# Patient Record
Sex: Female | Born: 1955 | Race: White | Hispanic: No | Marital: Married | Smoking: Never smoker
Health system: Southern US, Community
[De-identification: ages and names within clinical notes are randomized; demographics above are authoritative.]

## PROBLEM LIST (undated history)

## (undated) DIAGNOSIS — C4491 Basal cell carcinoma of skin, unspecified: Secondary | ICD-10-CM

## (undated) DIAGNOSIS — K219 Gastro-esophageal reflux disease without esophagitis: Secondary | ICD-10-CM

## (undated) DIAGNOSIS — C4492 Squamous cell carcinoma of skin, unspecified: Secondary | ICD-10-CM

## (undated) DIAGNOSIS — M1990 Unspecified osteoarthritis, unspecified site: Secondary | ICD-10-CM

## (undated) DIAGNOSIS — C4490 Unspecified malignant neoplasm of skin, unspecified: Secondary | ICD-10-CM

## (undated) HISTORY — DX: Unspecified malignant neoplasm of skin, unspecified: C44.90

## (undated) HISTORY — DX: Unspecified osteoarthritis, unspecified site: M19.90

## (undated) HISTORY — PX: TUBAL LIGATION: SHX77

## (undated) HISTORY — DX: Squamous cell carcinoma of skin, unspecified: C44.92

## (undated) HISTORY — PX: FOOT SURGERY: SHX648

## (undated) HISTORY — DX: Gastro-esophageal reflux disease without esophagitis: K21.9

## (undated) HISTORY — PX: DILATION AND CURETTAGE OF UTERUS: SHX78

## (undated) HISTORY — DX: Basal cell carcinoma of skin, unspecified: C44.91

---
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
## 1998-11-28 HISTORY — PX: BREAST EXCISIONAL BIOPSY: SUR124

## 1998-11-28 HISTORY — PX: BREAST BIOPSY: SHX20

## 2000-05-05 ENCOUNTER — Encounter: Admission: RE | Admit: 2000-05-05 | Discharge: 2000-05-05 | Payer: Self-pay | Admitting: Unknown Physician Specialty

## 2000-05-05 ENCOUNTER — Encounter: Payer: Self-pay | Admitting: Unknown Physician Specialty

## 2000-09-01 ENCOUNTER — Encounter: Admission: RE | Admit: 2000-09-01 | Discharge: 2000-09-01 | Payer: Self-pay | Admitting: Unknown Physician Specialty

## 2000-09-01 ENCOUNTER — Encounter: Payer: Self-pay | Admitting: Unknown Physician Specialty

## 2007-07-05 ENCOUNTER — Ambulatory Visit: Payer: Self-pay | Admitting: Gastroenterology

## 2007-09-05 ENCOUNTER — Ambulatory Visit: Payer: Self-pay | Admitting: Unknown Physician Specialty

## 2007-09-29 ENCOUNTER — Ambulatory Visit: Payer: Self-pay | Admitting: Unknown Physician Specialty

## 2010-11-28 HISTORY — PX: FOOT SURGERY: SHX648

## 2011-01-06 ENCOUNTER — Other Ambulatory Visit: Payer: Self-pay | Admitting: Podiatry

## 2011-01-06 DIAGNOSIS — M79671 Pain in right foot: Secondary | ICD-10-CM

## 2011-01-07 ENCOUNTER — Ambulatory Visit
Admission: RE | Admit: 2011-01-07 | Discharge: 2011-01-07 | Disposition: A | Discharge status: Home / Self Care | Payer: BC Managed Care – PPO | Source: Ambulatory Visit | Attending: Podiatry | Admitting: Podiatry

## 2011-01-07 DIAGNOSIS — M79671 Pain in right foot: Secondary | ICD-10-CM

## 2011-11-09 ENCOUNTER — Ambulatory Visit: Payer: Self-pay | Admitting: Gastroenterology

## 2013-12-13 ENCOUNTER — Encounter: Payer: Self-pay | Admitting: Rheumatology

## 2013-12-29 ENCOUNTER — Encounter: Payer: Self-pay | Admitting: Rheumatology

## 2014-01-02 ENCOUNTER — Ambulatory Visit: Payer: Self-pay | Admitting: Obstetrics and Gynecology

## 2014-01-02 LAB — BASIC METABOLIC PANEL
Anion Gap: 2 — ABNORMAL LOW (ref 7–16)
BUN: 15 mg/dL (ref 7–18)
Calcium, Total: 9.2 mg/dL (ref 8.5–10.1)
Chloride: 107 mmol/L (ref 98–107)
Co2: 26 mmol/L (ref 21–32)
Creatinine: 0.7 mg/dL (ref 0.60–1.30)
EGFR (African American): >60
EGFR (Non-African Amer.): >60
Glucose: 89 mg/dL (ref 65–99)
Osmolality: 270 (ref 275–301)
Potassium: 3.8 mmol/L (ref 3.5–5.1)
Sodium: 135 mmol/L — ABNORMAL LOW (ref 136–145)

## 2014-01-02 LAB — CBC
HCT: 40.2 % (ref 35.0–47.0)
HGB: 13.8 g/dL (ref 12.0–16.0)
MCH: 33 pg (ref 26.0–34.0)
MCHC: 34.5 g/dL (ref 32.0–36.0)
MCV: 96 fL (ref 80–100)
Platelet: 188 10*3/uL (ref 150–440)
RBC: 4.19 10*6/uL (ref 3.80–5.20)
RDW: 12.4 % (ref 11.5–14.5)
WBC: 8 10*3/uL (ref 3.6–11.0)

## 2014-01-06 ENCOUNTER — Ambulatory Visit: Payer: Self-pay | Admitting: Obstetrics and Gynecology

## 2014-01-08 LAB — PATHOLOGY REPORT

## 2014-01-26 ENCOUNTER — Encounter: Payer: Self-pay | Admitting: Rheumatology

## 2015-01-06 ENCOUNTER — Emergency Department: Payer: Self-pay | Admitting: Emergency Medicine

## 2015-03-21 NOTE — Op Note (Signed)
PATIENT NAME:  Pamela Jacobs, Pamela Jacobs MR#:  [id number] DATE OF BIRTH:  Jun 30, 1956  DATE OF PROCEDURE:  01/06/2014  PREOPERATIVE DIAGNOSES: 1.  Abnormal uterine bleeding.  2.  Endometrial polyp.   POSTOPERATIVE DIAGNOSES: 1.  Abnormal uterine bleeding.  2.  Endometrial polyp.   OPERATIVE PROCEDURE: Hysteroscopy with dilation and curettage of endometrium.   SURGEON: Malachi Paradise, MD  FIRST ASSISTANT: Etheleen Sia, PA-S  INDICATIONS: The patient is a 59 year old white female para 3-0-1-2, status post BTL, menopausal, on continuous hormone replacement therapy, who presents for evaluation of abnormal uterine bleeding. The patient developed episode of vaginal spotting. Preoperative work-up included pelvic ultrasound demonstrating a thickened endometrium with possible endometrial polyp being present.   FINDINGS AT SURGERY: The pelvis was gynecoid. The cervix descended to within 2 to 3 cm of the introitus with tenaculum traction. There was a 7 mm endometrial polyp noted on hysteroscopy. No other abnormal lesions were seen.   DESCRIPTION OF PROCEDURE: The patient was brought to the operating room where she was placed in the supine position. General anesthesia was induced without difficulty using the LMA technique. She was placed in the dorsal lithotomy position using the candy-cane stirrups. A Betadine perineal, intravaginal prep and drape was performed in standard fashion. A red Robinson catheter was used to drain less than 5 mL of urine from the bladder. A single-tooth tenaculum was placed on the anterior lip of the cervix. The cervix was noted to be stenotic with subsequent dilation. The uterus sounded to 9 cm. ACMI dilators were used up to a number 20- [place] caliber to dilate the endocervical canal. Hysteroscopy was performed using lactated Ringer's as irrigant to assess the intrauterine cavity. Photo documentation was made. Subsequently, a curettage was performed with both smooth and serrated  curettes. The stone polyp biopsy forceps were used to extract any residual tissue left behind. Repeat hysteroscopy demonstrated removal of all lesions. The polyp was identified separately and sent to pathology separately. Upon completion of the procedure, all instrumentation was removed from the vagina. The patient was then awakened, mobilized, and taken to the recovery room in satisfactory condition.   ESTIMATED BLOOD LOSS: Less than 25 mL.   IV FLUIDS: Were not quantified.   COUNTS: All instrument, needle, and sponge counts were verified as correct.  ____________________________ Alanda Slim. Hind Chesler, MD mad:sb D: 01/06/2014 08:18:03 ET T: 01/06/2014 11:51:09 ET JOB#: [id number]  cc: Hassell Done A. Oriana Horiuchi, MD, <Dictator> Alanda Slim Chelle Cayton MD ELECTRONICALLY SIGNED 01/15/2014 9:34

## 2015-06-09 ENCOUNTER — Telehealth: Payer: Self-pay | Admitting: Obstetrics and Gynecology

## 2015-06-09 DIAGNOSIS — R3915 Urgency of urination: Secondary | ICD-10-CM

## 2015-06-09 NOTE — Telephone Encounter (Signed)
PT CALLED AND SHE WAS TOLD TO CALL BACK IN IF MEDICATION WAS NOT WORKING, DR DE TOLD HER TO DOUBLE UP ON THE MEDICATION AND SHE DID  AND IT WAS NOT WORKING SHE IS STILL HAVING TO GET UP ABOUT 3-4 TIMES EACH NIGHT TO USE THE RESTROOM.

## 2015-06-09 NOTE — Telephone Encounter (Signed)
Pt seen in office 04/21/2015. C/o of urinary urgency advised to increase oxybutynin 5mg  to bid. Pt states sx are no better. Per mad she will need urology referral. Pt had no preference.  Will send to [facility].

## 2015-06-26 ENCOUNTER — Ambulatory Visit (INDEPENDENT_AMBULATORY_CARE_PROVIDER_SITE_OTHER): Payer: 59 | Admitting: Urology

## 2015-06-26 ENCOUNTER — Encounter: Payer: Self-pay | Admitting: Urology

## 2015-06-26 VITALS — BP 146/82 | HR 75 | Ht 64.0 in | Wt 186.0 lb

## 2015-06-26 DIAGNOSIS — R351 Nocturia: Secondary | ICD-10-CM | POA: Diagnosis not present

## 2015-06-26 DIAGNOSIS — R3915 Urgency of urination: Secondary | ICD-10-CM

## 2015-06-26 LAB — BLADDER SCAN AMB NON-IMAGING

## 2015-06-26 MED ORDER — MIRABEGRON ER 25 MG PO TB24: 25.0000 mg | ORAL_TABLET | Freq: Every day | ORAL | Status: DC

## 2015-06-26 NOTE — Patient Instructions (Signed)
Overactive Bladder The bladder has two functions that are totally opposite of the other. One is to relax and stretch out so it can store urine (fills like a balloon), and the other is to contract and squeeze down so that it can empty the urine that it has stored. Proper functioning of the bladder is a complex mixing of these two functions. The filling and emptying of the bladder can be influenced by:  The bladder.  The spinal cord.  The brain.  The nerves going to the bladder.  Other organs that are closely related to the bladder such as prostate in males and the vagina in females. As your bladder fills with urine, nerve signals are sent from the bladder to the brain to tell you that you may need to urinate. Normal urination requires that the bladder squeeze down with sufficient strength to empty the bladder, but this also requires that the bladder squeeze down sufficiently long to finish the job. In addition the sphincter muscles, which normally keep you from leaking urine, must also relax so that the urine can pass. Coordination between the bladder muscle squeezing down and the sphincter muscles relaxing is required to make everything happen normally. With an overactive bladder sometimes the muscles of the bladder contract unexpectedly and involuntarily and this causes an urgent need to urinate. The normal response is to try to hold urine in by contracting the sphincter muscles. Sometimes the bladder contracts so strongly that the sphincter muscles cannot stop the urine from passing out and incontinence occurs. This kind of incontinence is called urge incontinence. Having an overactive bladder can be embarrassing and awkward. It can keep you from living life the way you want to. Many people think it is just something you have to put up with as you grow older or have certain health conditions. In fact, there are treatments that can help make your life easier and more pleasant. CAUSES  Many things  can cause an overactive bladder. Possibilities include:  Urinary tract infection or infection of nearby tissues such as the prostate.  Prostate enlargement.  In women, multiple pregnancies or surgery on the uterus or urethra.  Bladder stones, inflammation, or tumors.  Caffeine.  Alcohol.  Medications. For example, diuretics (drugs that help the body get rid of extra fluid) increase urine production. Some other medicines must be taken with lots of fluids.  Muscle or nerve weakness. This might be the result of a spinal cord injury, a stroke, multiple sclerosis, or Parkinson disease.  Diabetes can cause a high urine volume which fills the bladder so quickly that the normal urge to urinate is triggered very strongly. SYMPTOMS   Loss of bladder control. You feel the need to urinate and cannot make your body wait.  Sudden, strong urges to urinate.  Urinating 8 or more times a day.  Waking up to urinate two or more times a night. DIAGNOSIS  To decide if you have overactive bladder, your health care provider will probably:  Ask about symptoms you have noticed.  Ask about your overall health. This will include questions about any medications you are taking.  Do a physical examination. This will help determine if there are obvious blockages or other problems.  Order some tests. These might include:  A blood test to check for diabetes or other health issues that could be contributing to the problem.  Urine testing. This could measure the flow of urine and the pressure on the bladder.  A test of your neurological   system (the brain, spinal cord, and nerves). This is the system that senses the need to urinate. Some of these tests are called flow tests, bladder pressure tests, and electrical measurements of the sphincter muscle.  A bladder test to check whether it is emptying completely when you urinate.  Cystoscopy. This test uses a thin tube with a tiny camera on it. It offers a  look inside your urethra and bladder to see if there are problems.  Imaging tests. You might be given a contrast dye and then asked to urinate. X-rays are taken to see how your bladder is working. TREATMENT  An overactive bladder can be treated in many ways. The treatment will depend on the cause. Whether you have a mild or severe case also makes a difference. Often, treatment can be given in your health care provider's office or clinic. Be sure to discuss the different options with your caregiver. They include:  Behavioral treatments. These do not involve medication or surgery:  Bladder training. For this, you would follow a schedule to urinate at regular intervals. This helps you learn to control the urge to urinate. At first, you might be asked to wait a few minutes after feeling the urge. In time, you should be able to schedule bathroom visits an hour or more apart.  Kegel exercises. These exercises strengthen the pelvic floor muscles, which support the bladder. Toning these muscles can help control urination even if the bladder muscles are overactive. A specialist will teach you how to do these exercises correctly. They will require daily practice.  Weight loss. If you are obese or overweight, losing weight might stop your bladder from being overactive. Talk to your health care provider about how many pounds you should lose. Also ask if there is a specific program or method that would work best for you.  Diet change. This might be suggested if constipation is making your overactive bladder worse. Your health care provider or a nutritionist can explain ways to change what you eat to ease constipation. Other people might need to take in less caffeine or alcohol. Sometimes drinking fewer fluids is needed, too.  Protection. This is not an actual treatment. But, you could wear special pads to take care of any leakage while you wait for other treatments to take effect. This will help you avoid  embarrassment.  Physical treatments.  Electrical stimulation. Electrodes will send gentle pulses to the nerves or muscles that help control the bladder. The goal is to strengthen them. Sometimes this is done with the electrodes outside the body. Or, they might be placed inside the body (implanted). This treatment can take several months to have an effect.  Medications. These are usually used along with other treatments. Several medicines are available. Some are injected into the muscles involved in urination. Others come in pill form. Medications sometimes prescribed include:  Anticholinergics. These drugs block the signals that the nerves deliver to the bladder. This keeps it from releasing urine at the wrong time. Researchers think the drugs might help in other ways, too.  Imipramine. This is an antidepressant. But, it relaxes bladder muscles.  Botox. This is still experimental. Some people believe that injecting it into the bladder muscles will relax them so they work more normally. It has also been injected into the sphincter muscle when the sphincter muscle does not open properly. This is a temporary fix, however. Also, it might make matters worse, especially in older people.  Surgery.  A device might be implanted   to help manage your nerves. It works on the nerves that signal when you need to urinate.  Surgery is sometimes needed with electrical stimulation. If the electrodes are implanted, this is done through surgery.  Sometimes repairs need to be made through surgery. For example, the size of the bladder can be changed. This is usually done in severe cases only. HOME CARE INSTRUCTIONS   Take any medications your health care provider prescribed or suggested. Follow the directions carefully.  Practice any lifestyle changes that are recommended. These might include:  Drinking less fluid or drinking at different times of the day. If you need to urinate often during the night, for  example, you may need to stop drinking fluids early in the evening.  Cutting down on caffeine or alcohol. They can both make an overactive bladder worse. Caffeine is found in coffee, tea, and sodas.  Doing Kegel exercises to strengthen muscles.  Losing weight, if that is recommended.  Eating a healthy and balanced diet. This will help you avoid constipation.  Keep a journal or a log. You might be asked to record how much you drink and when, and also when you feel the need to urinate.  Learn how to care for implants or other devices, such as pessaries. SEEK MEDICAL CARE IF:   Your overactive bladder gets worse.  You feel increased pain or irritation when you urinate.  You notice blood in your urine.  You have questions about any medications or devices that your health care provider recommended.  You notice blood, pus, or swelling at the site of any test or treatment procedure.  You have an oral temperature above 102F (38.9C). SEEK IMMEDIATE MEDICAL CARE IF:  You have an oral temperature above 102F (38.9C), not controlled by medicine. Document Released: 09/10/2009 Document Revised: 03/31/2014 Document Reviewed: 09/10/2009 Exit[employer] Patient Information 2015 [employer]. This information is not intended to replace advice given to you by your health care provider. Make sure you discuss any questions you have with your health care provider.  

## 2015-06-26 NOTE — Progress Notes (Signed)
06/26/2015 3:27 PM   Pamela Jacobs May 08, 1956 [id number]  Referring provider: Dr. Enzo Bi    Chief Complaint  Patient presents with  . Urinary Urgency    HPI:  59 year old female referred by Dr. Enzo Bi for urinary frequency.  She has experience constant pressure in her bladder starting several months ago,  urinary frequency and occasional urgency. She denies any urge incontinence, both stress or urge.    She gets up 3-4 times nightly to urinate.  No dysuria, no gross hematuria.  She does feel that she is emptying her bladder  Denies any straining with urination.   She was started on oxybutynin 10 mg XL without  much improvement in her symptoms.   She is also trialed tolterodine 4 mg without any change in her symptoms.    She does report that this same set of symptoms occurred approximately one year ago and cleared up after a month or 2 but since recurred.   She does drink diet coke or water for breakfast, iced tea,  And one glass of wine on a regular daily basis. She does not drink much water. She has avoided drinking after 6 PM to help with her nocturia symptoms but continued despite this behavioral modification.  She denies pain with intercourse,  vaginal bulging or pressure, or any other symptoms related to pelvic organ prolapse.  No history kidney stones.  No UTIs.  No flank pain.    PMH: Past Medical History  Diagnosis Date  . GERD (gastroesophageal reflux disease)   . Arthritis   . Skin cancer     Surgical History: Past Surgical History  Procedure Laterality Date  . Nasal septum surgery  1975  . Cesarean section  [id number]  . Foot surgery  2012    plate placed    Home Medications:    Medication List       This list is accurate as of: 06/26/15  3:27 PM.  Always use your most recent med list.               cyclobenzaprine 5 MG tablet  Commonly known as:  FLEXERIL  Take 5 mg by mouth 3 (three) times daily as needed for muscle spasms.     MINIVELLE 0.075 MG/24HR  Generic drug:  estradiol  APPLY 1 PATCH TO SKIN TWICE WEEKLY     mirabegron ER 25 MG Tb24 tablet  Commonly known as:  MYRBETRIQ  Take 1 tablet (25 mg total) by mouth daily.     norethindrone 5 MG tablet  Commonly known as:  AYGESTIN  Take 2.5 mg by mouth daily.     Vitamin D (Ergocalciferol) 50000 UNITS Caps capsule  Commonly known as:  DRISDOL  TAKE 1 CAPSULE BY MOUTH WEEKLY        Allergies: No Known Allergies  Family History: Family History  Problem Relation Age of Onset  . Bladder Cancer Neg Hx   . Kidney cancer Neg Hx   . Prostate cancer Neg Hx     Social History:  reports that she has never smoked. She does not have any smokeless tobacco history on file. She reports that she drinks alcohol. She reports that she does not use illicit drugs.  ROS: UROLOGY Frequent Urination?: Yes Hard to postpone urination?: No Burning/pain with urination?: No Get up at night to urinate?: Yes Leakage of urine?: Yes Urine stream starts and stops?: No Trouble starting stream?: No Do you have to strain to urinate?: No Blood in urine?: No Urinary  tract infection?: No Sexually transmitted disease?: No Injury to kidneys or bladder?: No Painful intercourse?: No Weak stream?: No Currently pregnant?: No Vaginal bleeding?: No Last menstrual period?: post menapausal  Gastrointestinal Nausea?: No Vomiting?: No Indigestion/heartburn?: Yes Diarrhea?: Yes Constipation?: No  Constitutional Fever: No Night sweats?: No Weight loss?: No Fatigue?: No  Skin Skin rash/lesions?: No Itching?: No  Eyes Blurred vision?: No Double vision?: No  Ears/Nose/Throat Sore throat?: No Sinus problems?: No  Hematologic/Lymphatic Swollen glands?: No Easy bruising?: No  Cardiovascular Leg swelling?: No Chest pain?: No  Respiratory Cough?: No Shortness of breath?: No  Endocrine Excessive thirst?: No  Musculoskeletal Back pain?: No Joint pain?:  Yes  Neurological Headaches?: No Dizziness?: No  Psychologic Depression?: No Anxiety?: No  Physical Exam: BP 146/82 mmHg  Pulse 75  Ht 5\' 4"  (1.626 m)  Wt 186 lb (84.369 kg)  BMI 31.91 kg/m2  Constitutional:  Alert and oriented, No acute distress. HEENT: [place] AT, moist mucus membranes.  Trachea midline, no masses. Cardiovascular: No clubbing, cyanosis, or edema. Respiratory: Normal respiratory effort, no increased work of breathing. GI: Abdomen is soft, nontender, nondistended, no abdominal masses GU: No CVA tenderness.  Skin: No rashes, bruises or suspicious lesions. Neurologic: Grossly intact, no focal deficits, moving all 4 extremities. Psychiatric: Normal mood and affect.  Laboratory Data: Lab Results  Component Value Date   WBC 8.0 01/02/2014   HGB 13.8 01/02/2014   HCT 40.2 01/02/2014   MCV 96 01/02/2014   PLT 188 01/02/2014    Lab Results  Component Value Date   CREATININE 0.70 01/02/2014    Urinalysis Results for orders placed or performed in visit on 06/26/15  Microscopic Examination  Result Value Ref Range   WBC, UA 0-5 0 -  5 /hpf   RBC, UA None seen 0 -  2 /hpf   Epithelial Cells (non renal) 0-10 0 - 10 /hpf   Renal Epithel, UA None seen None seen /hpf   Crystals Present (A) N/A   Crystal Type Amorphous Sediment N/A   Bacteria, UA None seen None seen/Few  Urinalysis, Complete  Result Value Ref Range   Specific Gravity, UA 1.020 1.005 - 1.030   pH, UA 7.5 5.0 - 7.5   Color, UA Yellow Yellow   Appearance Ur Hazy (A) Clear   Leukocytes, UA Negative Negative   Protein, UA Negative Negative/Trace   Glucose, UA Negative Negative   Ketones, UA Negative Negative   RBC, UA Negative Negative   Bilirubin, UA Negative Negative   Urobilinogen, Ur 0.2 0.2 - 1.0 mg/dL   Nitrite, UA Negative Negative   Microscopic Examination See below:   BLADDER SCAN AMB NON-IMAGING  Result Value Ref Range   Scan Result 24ml      Pertinent Imaging: Results for  orders placed or performed in visit on 06/26/15  BLADDER SCAN AMB NON-IMAGING  Result Value Ref Range   Scan Result 28ml      Assessment & Plan:   59 year old female with primary  complaint of urinary frequency and urgency. Postvoid residual today is minimal. No evidence of infection on UA.   We spent quite some time today discussing behavioral modification and avoidance of irritating beverages which are her primary fluid intake.  She has failed to anticholinergic medications therefore will try a beta 3 agonist. 2 weeks of samples of Mirbetriq 25 mg given today.   1. Urinary urgency Trial mirabegron 25 mg, will call if thinks may need higher dose.  We also discussed other  alternatives for overactive bladder moving forward briefly today. Plan to reassess in 6 weeks. - Urinalysis, Complete - BLADDER SCAN AMB NON-IMAGING - mirabegron ER (MYRBETRIQ) 25 MG TB24 tablet; Take 1 tablet (25 mg total) by mouth daily.  Dispense: 30 tablet; Refill: 5 - Microscopic Examination  2. Nocturia See above    Return in about 6 weeks (around 08/07/2015) for PVR, symptom recheck, possible pelvic exam.  Hollice Espy, MD  [facility] [address], [place], [place] [address] [phone]

## 2015-06-27 LAB — URINALYSIS, COMPLETE
BILIRUBIN UA: NEGATIVE
GLUCOSE, UA: NEGATIVE
Ketones, UA: NEGATIVE
Leukocytes, UA: NEGATIVE
Nitrite, UA: NEGATIVE
Protein, UA: NEGATIVE
RBC, UA: NEGATIVE
SPEC GRAV UA: 1.02 (ref 1.005–1.030)
UUROB: 0.2 mg/dL (ref 0.2–1.0)
pH, UA: 7.5 (ref 5.0–7.5)

## 2015-06-27 LAB — MICROSCOPIC EXAMINATION
Bacteria, UA: NONE SEEN
RBC MICROSCOPIC, UA: NONE SEEN /HPF (ref 0–?)
Renal Epithel, UA: NONE SEEN /hpf

## 2015-08-10 ENCOUNTER — Ambulatory Visit (INDEPENDENT_AMBULATORY_CARE_PROVIDER_SITE_OTHER): Payer: 59 | Admitting: Urology

## 2015-08-10 ENCOUNTER — Encounter: Payer: Self-pay | Admitting: Urology

## 2015-08-10 VITALS — BP 143/90 | HR 59 | Resp 14 | Ht 64.0 in | Wt 188.0 lb

## 2015-08-10 DIAGNOSIS — R351 Nocturia: Secondary | ICD-10-CM

## 2015-08-10 DIAGNOSIS — R3915 Urgency of urination: Secondary | ICD-10-CM

## 2015-08-10 LAB — BLADDER SCAN AMB NON-IMAGING: Scan Result: 58

## 2015-08-10 NOTE — Progress Notes (Signed)
08/10/2015 9:25 AM   Pamela Jacobs 1956-01-30 [id number]  Referring provider: Lenard Simmer, MD [address] [place], [place] [address]  Chief Complaint  Patient presents with  . Urinary Urgency    6 week recheck  . Nocturia    HPI: Patient is a 59 year old white female who presents today for a 6 week recheck after being placed on Myrbetriq 25 mg for her symptoms of urinary frequency, urinary urgency and nocturia.    Patient found great relief while taking the Myrbetriq 25 mg daily, but her husband's insurance will not cover the medication.  She states they won't even consider a letter of medical necessity or an appeal.  She has failed two anti-cholinergics in the past, oxybutynin and tolterodine.    Previous history: She has experience constant pressure in her bladder starting several months ago, urinary frequency and occasional urgency. She denies any urge incontinence, both stress or urge. She gets up 3-4 times nightly to urinate. No dysuria, no gross hematuria. She does feel that she is emptying her bladder Denies any straining with urination.  She denies pain with intercourse, vaginal bulging or pressure, or any other symptoms related to pelvic organ prolapse.  No history kidney stones. No UTI's. No flank pain.   She is not experiencing any dysuria, gross hematuria or suprapubic pain at this time.  She is also not experiencing any fevers, chills, nausea or vomiting.    PMH: Past Medical History  Diagnosis Date  . GERD (gastroesophageal reflux disease)   . Arthritis   . Skin cancer     Surgical History: Past Surgical History  Procedure Laterality Date  . Nasal septum surgery  1975  . Cesarean section  [id number]  . Foot surgery  2012    plate placed    Home Medications:    Medication List       This list is accurate as of: 08/10/15  9:25 AM.  Always use your most recent med list.               cyclobenzaprine 5 MG tablet  Commonly known as:   FLEXERIL  Take 5 mg by mouth 3 (three) times daily as needed for muscle spasms.     MINIVELLE 0.075 MG/24HR  Generic drug:  estradiol  APPLY 1 PATCH TO SKIN TWICE WEEKLY     norethindrone 5 MG tablet  Commonly known as:  AYGESTIN  Take 2.5 mg by mouth daily.     Vitamin D (Ergocalciferol) 50000 UNITS Caps capsule  Commonly known as:  DRISDOL  TAKE 1 CAPSULE BY MOUTH WEEKLY        Allergies: No Known Allergies  Family History: Family History  Problem Relation Age of Onset  . Bladder Cancer Neg Hx   . Kidney cancer Neg Hx   . Prostate cancer Neg Hx     Social History:  reports that she has never smoked. She does not have any smokeless tobacco history on file. She reports that she drinks alcohol. She reports that she does not use illicit drugs.  ROS: UROLOGY Frequent Urination?: Yes Hard to postpone urination?: No Burning/pain with urination?: No Get up at night to urinate?: Yes Leakage of urine?: Yes Urine stream starts and stops?: No Trouble starting stream?: No Do you have to strain to urinate?: No Blood in urine?: No Urinary tract infection?: No Sexually transmitted disease?: No Injury to kidneys or bladder?: No Painful intercourse?: No Weak stream?: No Currently pregnant?: No Vaginal bleeding?:  No Last menstrual period?: post  Gastrointestinal Nausea?: No Vomiting?: No Indigestion/heartburn?: No Diarrhea?: No Constipation?: No  Constitutional Fever: No Night sweats?: No Weight loss?: No Fatigue?: No  Skin Skin rash/lesions?: No Itching?: No  Eyes Blurred vision?: No Double vision?: No  Ears/Nose/Throat Sore throat?: No Sinus problems?: No  Hematologic/Lymphatic Swollen glands?: No Easy bruising?: No  Cardiovascular Leg swelling?: No Chest pain?: No  Respiratory Cough?: No Shortness of breath?: No  Endocrine Excessive thirst?: No  Musculoskeletal Back pain?: No Joint pain?: No  Neurological Headaches?: No Dizziness?:  No  Psychologic Depression?: No Anxiety?: No  Physical Exam: BP 143/90 mmHg  Pulse 59  Resp 14  Ht 5\' 4"  (1.626 m)  Wt 188 lb (85.276 kg)  BMI 32.25 kg/m2   Laboratory Data: Lab Results  Component Value Date   WBC 8.0 01/02/2014   HGB 13.8 01/02/2014   HCT 40.2 01/02/2014   MCV 96 01/02/2014   PLT 188 01/02/2014    Lab Results  Component Value Date   CREATININE 0.70 01/02/2014   Urinalysis    Component Value Date/Time   GLUCOSEU Negative 06/26/2015 1449   BILIRUBINUR Negative 06/26/2015 1449   NITRITE Negative 06/26/2015 1449   LEUKOCYTESUR Negative 06/26/2015 1449    Pertinent Imaging: Results for orders placed or performed in visit on 08/10/15  BLADDER SCAN AMB NON-IMAGING  Result Value Ref Range   Scan Result 58     Assessment & Plan:    1. Urinary urgency:   Patient had a good response to Myrbetriq 25 mg daily, but her insurance will not cover the medication and she cannot afford the out of pocket cost.  She has failed two anticholinergics in the past.  We discussed trying another anticholinergic, PTNS, PNE, or Botox therapy.   The patient would like to try another anticholinergic.  I have given her Toviaz 8 mg daily (#42).  I am not hopeful that this will control her symptoms since she has failed two other anticholinergics.   I have advised her of the side effects of Toviaz, such as: Dry eyes, dry mouth, constipation, mental confusion and/or urinary retention.  She will RTC in 6 weeks for symptom recheck and PVR.    - BLADDER SCAN AMB NON-IMAGING  2. Nocturia:   Patient had good control with Myrbetriq 25 mg daily, but insurance will not cover the medication.  Hopefully, the Lisbeth Ply will be as effective.  She will RTC in 6 weeks for symptom recheck and PVR.    No Follow-up on file.  Zara Council, [facility] [address], [place], [place] [address] [phone]

## 2015-09-03 ENCOUNTER — Telehealth: Payer: Self-pay | Admitting: Obstetrics and Gynecology

## 2015-09-03 NOTE — Telephone Encounter (Signed)
Pt is on Minivelle .075 mg and pharmacy told her its going generic, but other doses are not. She wants to know what to do. Should she be put on something else or get another dose that is not generic?

## 2015-09-03 NOTE — Telephone Encounter (Signed)
Pts pharmacy is unable to get  Generic or name brand minivelle in 0.075mg . She was told per mad she can only use name brand. Want  to switch her to another brand or change dose of minivelle? pls advise.

## 2015-09-04 MED ORDER — ESTRADIOL 0.05 MG/24HR TD PTTW: 1.0000 | MEDICATED_PATCH | TRANSDERMAL | Status: DC

## 2015-09-04 NOTE — Telephone Encounter (Signed)
Pt aware per TB that per mad minivelle 0.5 erx.

## 2015-09-22 ENCOUNTER — Ambulatory Visit: Payer: 59 | Admitting: Urology

## 2015-11-05 ENCOUNTER — Telehealth: Payer: Self-pay | Admitting: Obstetrics and Gynecology

## 2015-11-05 MED ORDER — NORETHINDRONE ACETATE 5 MG PO TABS: 2.5000 mg | ORAL_TABLET | Freq: Every day | ORAL | Status: DC

## 2015-11-05 MED ORDER — ESTRADIOL 1 MG PO TABS: 1.0000 mg | ORAL_TABLET | Freq: Every day | ORAL | Status: DC

## 2015-11-05 NOTE — Telephone Encounter (Signed)
Pt aware meds erx. Has an appt 01/11/15. Will f/u then.

## 2015-11-05 NOTE — Telephone Encounter (Signed)
Pharmacy cant get Minivelle and generic is not covered by [facility]. Can you prescribe something else.

## 2016-01-12 ENCOUNTER — Encounter: Payer: Self-pay | Admitting: Obstetrics and Gynecology

## 2016-01-12 ENCOUNTER — Ambulatory Visit (INDEPENDENT_AMBULATORY_CARE_PROVIDER_SITE_OTHER): Payer: 59 | Admitting: Obstetrics and Gynecology

## 2016-01-12 VITALS — BP 136/70 | HR 76 | Ht 64.0 in | Wt 188.8 lb

## 2016-01-12 DIAGNOSIS — Z1211 Encounter for screening for malignant neoplasm of colon: Secondary | ICD-10-CM | POA: Diagnosis not present

## 2016-01-12 DIAGNOSIS — R638 Other symptoms and signs concerning food and fluid intake: Secondary | ICD-10-CM

## 2016-01-12 DIAGNOSIS — Z1239 Encounter for other screening for malignant neoplasm of breast: Secondary | ICD-10-CM

## 2016-01-12 DIAGNOSIS — Z0000 Encounter for general adult medical examination without abnormal findings: Secondary | ICD-10-CM

## 2016-01-12 DIAGNOSIS — Z01419 Encounter for gynecological examination (general) (routine) without abnormal findings: Secondary | ICD-10-CM

## 2016-01-12 DIAGNOSIS — Z780 Asymptomatic menopausal state: Secondary | ICD-10-CM | POA: Insufficient documentation

## 2016-01-12 MED ORDER — NORETHINDRONE ACETATE 5 MG PO TABS: 2.5000 mg | ORAL_TABLET | Freq: Every day | ORAL | Status: DC

## 2016-01-12 MED ORDER — ESTRADIOL 1 MG PO TABS: 1.0000 mg | ORAL_TABLET | Freq: Every day | ORAL | Status: DC

## 2016-01-12 NOTE — Patient Instructions (Signed)
1.  No Pap smear this year. 2.  Mammogram ordered. 3.  Stool guaiac cards for colon cancer screening are ordered. 4.  Encourage healthy eating and exercise with goal of 12 pound weight loss in 1 year. 5.  Patient is to consider phentermine therapy, contrave, or garcinia Cambogia herbal supplement. 6.  Return 1 year. 7.  Stopped HRT therapy 6 weeks prior to next annual exam to see if vasomotor symptoms are resolved off HRT.

## 2016-01-12 NOTE — Progress Notes (Signed)
Patient ID: Pamela Jacobs, female   DOB: 10/10/1956, 60 y.o.   MRN: [id number] ANNUAL PREVENTATIVE CARE GYN  ENCOUNTER NOTE  Subjective:       Pamela Jacobs is a 60 y.o. No obstetric history on file. female here for a routine annual gynecologic exam.  Current complaints: 1.  none   Patient is interested in possible weight loss therapy.  Phentermine has not worked recently.  Patient had questions about Contrave. Patient is on HRT therapy without abnormal uterine bleeding.   Gynecologic History No LMP recorded. Patient is postmenopausal. Contraception: post menopausal status Last Pap: 01/02/2014 neg.neg. Results were: normal Last mammogram: 12/2014 birad 1. Results were: normal History of hysteroscopy/D&C for abnormal uterine bleeding; pathology-polyps No vasomotor symptoms on HRT therapy  Obstetric History OB History  No data available    Past Medical History  Diagnosis Date  . GERD (gastroesophageal reflux disease)   . Arthritis   . Skin cancer     Past Surgical History  Procedure Laterality Date  . Nasal septum surgery  1975  . Cesarean section  [id number]  . Foot surgery  2012    plate placed    Current Outpatient Prescriptions on File Prior to Visit  Medication Sig Dispense Refill  . estradiol (ESTRACE) 1 MG tablet Take 1 tablet (1 mg total) by mouth daily. 30 tablet 3  . norethindrone (AYGESTIN) 5 MG tablet Take 0.5 tablets (2.5 mg total) by mouth daily. 30 tablet 3  . Vitamin D, Ergocalciferol, (DRISDOL) 50000 UNITS CAPS capsule TAKE 1 CAPSULE BY MOUTH WEEKLY  1   No current facility-administered medications on file prior to visit.    No Known Allergies  Social History   Social History  . Marital Status: Married    Spouse Name: N/A  . Number of Children: N/A  . Years of Education: N/A   Occupational History  . Not on file.   Social History Main Topics  . Smoking status: Never Smoker   . Smokeless tobacco: Not on file  . Alcohol Use: 0.0 oz/week    0  Standard drinks or equivalent per week     Comment: occasionaly  . Drug Use: No  . Sexual Activity: Yes    Birth Control/ Protection: Post-menopausal   Other Topics Concern  . Not on file   Social History Narrative    Family History  Problem Relation Age of Onset  . Bladder Cancer Neg Hx   . Kidney cancer Neg Hx   . Prostate cancer Neg Hx   . Cancer Neg Hx   . Diabetes Neg Hx   . Heart disease Neg Hx     The following portions of the patient's history were reviewed and updated as appropriate: allergies, current medications, past family history, past medical history, past social history, past surgical history and problem list.  Review of Systems ROS Review of Systems - General ROS: negative for - chills, fatigue, fever, hot flashes, night sweats, weight gain or weight loss Psychological ROS: negative for - anxiety, decreased libido, depression, mood swings, physical abuse or sexual abuse Ophthalmic ROS: negative for - blurry vision, eye pain or loss of vision ENT ROS: negative for - headaches, hearing change, visual changes or vocal changes Allergy and Immunology ROS: negative for - hives, itchy/watery eyes or seasonal allergies Hematological and Lymphatic ROS: negative for - bleeding problems, bruising, swollen lymph nodes or weight loss Endocrine ROS: negative for - galactorrhea, hair pattern changes, hot flashes, malaise/lethargy, mood swings, palpitations, polydipsia/polyuria,  skin changes, temperature intolerance or unexpected weight changes Breast ROS: negative for - new or changing breast lumps or nipple discharge Respiratory ROS: negative for - cough or shortness of breath Cardiovascular ROS: negative for - chest pain, irregular heartbeat, palpitations or shortness of breath Gastrointestinal ROS: no abdominal pain, change in bowel habits, or black or bloody stools Genito-Urinary ROS: no dysuria, trouble voiding, or hematuria Musculoskeletal ROS: negative for - joint pain  or joint stiffness Neurological ROS: negative for - bowel and bladder control changes Dermatological ROS: negative for rash and skin lesion changes   Objective:   BP 136/70 mmHg  Pulse 76  Ht 5\' 4"  (1.626 m)  Wt 188 lb 12.8 oz (85.639 kg)  BMI 32.39 kg/m2 CONSTITUTIONAL: Well-developed, well-nourished female in no acute distress.  PSYCHIATRIC: Normal mood and affect. Normal behavior. Normal judgment and thought content. [place]: Alert and oriented to person, place, and time. Normal muscle tone coordination. No cranial nerve deficit noted. HENT:  Normocephalic, atraumatic, External right and left ear normal. Oropharynx is clear and moist EYES: Conjunctivae and EOM are normal. No scleral icterus.  NECK: Normal range of motion, supple, no masses.  Normal thyroid.  SKIN: Skin is warm and dry. No rash noted. Not diaphoretic. No erythema. No pallor. CARDIOVASCULAR: Normal heart rate noted, regular rhythm, no murmur. RESPIRATORY: Clear to auscultation bilaterally. Effort and breath sounds normal, no problems with respiration noted. BREASTS: Symmetric in size. No masses, skin changes, nipple drainage, or lymphadenopathy. ABDOMEN: Soft, normal bowel sounds, no distention noted.  No tenderness, rebound or guarding.  BLADDER: Normal PELVIC:  External Genitalia: Normal  BUS: Normal  Vagina: Normal  Cervix: Normal; No lesions  Uterus: Normal; Midplane, normal size and shape, mobile, nontender  Adnexa: Normal; Nonpalpable and nontender  RV: External Exam NormaI, No Rectal Masses and Normal Sphincter tone  MUSCULOSKELETAL: Normal range of motion. No tenderness.  No cyanosis, clubbing, or edema.  2+ distal pulses. LYMPHATIC: No Axillary, Supraclavicular, or Inguinal Adenopathy.    Assessment:   Annual gynecologic examination 60 y.o. Contraception: post menopausal status bmi- 32  Menopausal, asymptomatic  On HRT therapy  Plan:  Pap: Not needed Mammogram: Ordered Stool Guaiac Testing:   Ordered Labs: thru pcp Routine preventative health maintenance measures emphasized: Exercise/Diet/Weight control, Tobacco Warnings and Alcohol/Substance use risks Refill HRT 1 year. Encouraged healthy eating and exercise with goal of 12 pound weight loss in 1 year Return to [place], CMA  Brayton Mars, MD  Note: This dictation was prepared with Dragon dictation along with smaller phrase technology. Any transcriptional errors that result from this process are unintentional.

## 2016-01-27 ENCOUNTER — Ambulatory Visit
Admission: RE | Admit: 2016-01-27 | Discharge: 2016-01-27 | Disposition: A | Discharge status: Home / Self Care | Payer: 59 | Source: Ambulatory Visit | Attending: Obstetrics and Gynecology | Admitting: Obstetrics and Gynecology

## 2016-01-27 DIAGNOSIS — Z1239 Encounter for other screening for malignant neoplasm of breast: Secondary | ICD-10-CM

## 2016-01-27 DIAGNOSIS — Z1231 Encounter for screening mammogram for malignant neoplasm of breast: Secondary | ICD-10-CM | POA: Insufficient documentation

## 2016-02-17 ENCOUNTER — Telehealth: Payer: Self-pay | Admitting: Obstetrics and Gynecology

## 2016-02-17 MED ORDER — ESTRADIOL 1 MG PO TABS: 1.0000 mg | ORAL_TABLET | Freq: Every day | ORAL | Status: DC

## 2016-02-17 NOTE — Telephone Encounter (Signed)
Pt picked up her estridiol at [facility] and it was only 9 pills, they told her that's how Dr Tennis Must wrote the rx. Pt said she is supposed to take daily

## 2016-02-17 NOTE — Telephone Encounter (Signed)
Stradiol estrogen pill

## 2016-02-17 NOTE — Telephone Encounter (Signed)
Pt aware I erx incorrect amount. Fixed. Pt aware.

## 2016-03-02 ENCOUNTER — Encounter: Payer: Self-pay | Admitting: Podiatry

## 2016-03-02 ENCOUNTER — Ambulatory Visit: Payer: Self-pay

## 2016-03-02 ENCOUNTER — Ambulatory Visit (INDEPENDENT_AMBULATORY_CARE_PROVIDER_SITE_OTHER): Payer: 59

## 2016-03-02 ENCOUNTER — Telehealth: Payer: Self-pay | Admitting: *Deleted

## 2016-03-02 ENCOUNTER — Ambulatory Visit (INDEPENDENT_AMBULATORY_CARE_PROVIDER_SITE_OTHER): Payer: 59 | Admitting: Podiatry

## 2016-03-02 VITALS — BP 142/87 | HR 73 | Resp 16

## 2016-03-02 DIAGNOSIS — M79671 Pain in right foot: Secondary | ICD-10-CM

## 2016-03-02 DIAGNOSIS — M779 Enthesopathy, unspecified: Secondary | ICD-10-CM

## 2016-03-02 DIAGNOSIS — M79672 Pain in left foot: Secondary | ICD-10-CM

## 2016-03-02 MED ORDER — PREDNISONE 10 MG PO TABS: ORAL_TABLET | ORAL | Status: DC

## 2016-03-02 MED ORDER — NONFORMULARY OR COMPOUNDED ITEM: Status: DC

## 2016-03-02 MED ORDER — TRIAMCINOLONE ACETONIDE 10 MG/ML IJ SUSP
10.0000 mg | Freq: Once | INTRAMUSCULAR | Status: AC
  Administered 2016-03-02: 10 mg

## 2016-03-02 NOTE — Telephone Encounter (Signed)
Dr. Paulla Dolly ordered [place] cream.  Faxed.

## 2016-03-02 NOTE — Progress Notes (Signed)
   Subjective:    Patient ID: Pamela Jacobs, female    DOB: 03/17/1956, 60 y.o.   MRN: [id number]  HPI  Pt presents with pain in right foot, h/o sx, pain sharp and shooting in ankle. Left foot hurts anterior ankle  Review of Systems  Musculoskeletal: Positive for arthralgias.  All other systems reviewed and are negative.      Objective:   Physical Exam        Assessment & Plan:

## 2016-03-02 NOTE — Progress Notes (Signed)
Subjective:     Patient ID: Pamela Jacobs, female   DOB: 10-12-1956, 60 y.o.   MRN: [id number]  HPI patient presents stating she's developed pain in the ankles of both feet and also some discomfort in the top of both feet after having had surgery over 4 years ago on the right foot. States that overall she's done better over these years but recently has started to develop problems and has started an exercise program with utilization of elliptical machine   Review of Systems  All other systems reviewed and are negative.      Objective:   Physical Exam  Constitutional: She is oriented to person, place, and time.  Cardiovascular: Intact distal pulses.   Musculoskeletal: Normal range of motion.  Neurological: She is oriented to person, place, and time.  Skin: Skin is warm.  Nursing note and vitals reviewed.  neurovascular status found to be intact with muscle strength adequate range of motion within normal limits. Patient's found to have discomfort in the right and left ankle dorsal and is also noted to have mild discomfort on the top of the right over over left foot within the midtarsal joint. Previous scar right mid foot has healed very well and there is no swelling associated with this. Patient has moderate depression of the arch     Assessment:     Inflammatory changes which may be due to exercise or other issues versus inflammatory midtarsal joint pain secondary to arthritis and previous fusion surgery right    Plan:     H&P and x-rays reviewed with patient. Today I injected the ankle region bilateral 3 mg Kenalog 5 mg Xylocaine advised on physical therapy and placed on a 10 mg 12 day steroid Dosepak. 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

## 2016-03-08 ENCOUNTER — Telehealth: Payer: Self-pay | Admitting: *Deleted

## 2016-03-08 NOTE — Telephone Encounter (Signed)
Patient called stating that she has already paid for her compounding cream because her insurance wouldn't cover it, but she hasn't received it yet. I called pt and gave her [place] phone number so she could verify her information.

## 2016-03-23 ENCOUNTER — Encounter: Payer: Self-pay | Admitting: Podiatry

## 2016-03-23 ENCOUNTER — Ambulatory Visit (INDEPENDENT_AMBULATORY_CARE_PROVIDER_SITE_OTHER): Payer: 59 | Admitting: Podiatry

## 2016-03-23 DIAGNOSIS — M779 Enthesopathy, unspecified: Secondary | ICD-10-CM | POA: Diagnosis not present

## 2016-03-23 MED ORDER — TRIAMCINOLONE ACETONIDE 10 MG/ML IJ SUSP
10.0000 mg | Freq: Once | INTRAMUSCULAR | Status: AC
  Administered 2016-03-23: 10 mg

## 2016-03-23 NOTE — Progress Notes (Signed)
Subjective:     Patient ID: Pamela Jacobs, female   DOB: 05-03-1956, 60 y.o.   MRN: [id number]  HPI patient states I'm doing pretty well with pain on the dorsum of my right foot which has lessened but is still present in my left foot is doing very well   Review of Systems     Objective:   Physical Exam Neurovascular status intact with pain in the dorsal area of the right foot with inflammation and fluid buildup    Assessment:     Dorsal tendinitis improved and slightly more distal than previously present with the left foot doing great    Plan:     Careful injection of the dorsal tendon complex right 3 mg Kenalog 5 mg Xylocaine and advised on physical therapy anti-inflammatories and will be seen back to recheck

## 2016-08-20 ENCOUNTER — Other Ambulatory Visit: Payer: Self-pay | Admitting: Obstetrics and Gynecology

## 2017-01-12 ENCOUNTER — Encounter: Payer: 59 | Admitting: Obstetrics and Gynecology

## 2017-01-12 NOTE — Progress Notes (Signed)
Patient ID: Pamela Jacobs, female   DOB: [birth year], 61 y.o.   MRN: [id number] ANNUAL PREVENTATIVE CARE GYN  ENCOUNTER NOTE  Subjective:       Pamela Jacobs is a 61 y.o. G3 P2012 . female here for a routine annual gynecologic exam.  Current complaints: 1. D/c estradiol- 10/2016- having hot flashes  Patient has been on hormone replacement therapy for 11 years. She desires to continue with hormone replacement therapy because of the hot flashes experienced since the discontinuation of estradiol and Aygestin    Gynecologic History No LMP recorded. Patient is postmenopausal. Contraception: post menopausal status Last Pap: 01/02/2014 neg.neg. Results were: normal Last mammogram: 01/27/2016  birad 1. Results were: normal History of hysteroscopy/D&C for abnormal uterine bleeding; pathology-polyps No vasomotor symptoms on HRT therapy  Obstetric History OB History  No data available    Past Medical History:  Diagnosis Date  . Arthritis   . GERD (gastroesophageal reflux disease)   . Skin cancer     Past Surgical History:  Procedure Laterality Date  . BREAST BIOPSY Left 2000   benign  . CESAREAN SECTION  [id number]  . FOOT SURGERY  2012   plate placed  . NASAL SEPTUM SURGERY  1975    Current Outpatient Prescriptions on File Prior to Visit  Medication Sig Dispense Refill  . estradiol (ESTRACE) 1 MG tablet Take 1 tablet (1 mg total) by mouth daily. 90 tablet 3  . NONFORMULARY OR COMPOUNDED ITEM [facility] compound:  Antiinflammatory cream - Diclofenac 3%, Baclofen 2%, Cyclobenzaprine 2%, Lidocaine 2%, dispense 180 grams, apply 1-2 grams to affected area 3-4 times a day, +3 refills. 180 each 3  . norethindrone (AYGESTIN) 5 MG tablet Take 0.5 tablets (2.5 mg total) by mouth daily. 30 tablet 6  . norethindrone (AYGESTIN) 5 MG tablet TAKE 0.5 TABLETS (2.5 MG TOTAL) BY MOUTH DAILY. 90 tablet 1  . Vitamin D, Ergocalciferol, (DRISDOL) 50000 UNITS CAPS capsule TAKE 1 CAPSULE BY MOUTH  WEEKLY  1   No current facility-administered medications on file prior to visit.     No Known Allergies  Social History   Social History  . Marital status: Married    Spouse name: N/A  . Number of children: N/A  . Years of education: N/A   Occupational History  . Not on file.   Social History Main Topics  . Smoking status: Never Smoker  . Smokeless tobacco: Not on file  . Alcohol use 0.0 oz/week     Comment: occasionaly  . Drug use: No  . Sexual activity: Yes    Birth control/ protection: Post-menopausal   Other Topics Concern  . Not on file   Social History Narrative  . No narrative on file    Family History  Problem Relation Age of Onset  . Bladder Cancer Neg Hx   . Kidney cancer Neg Hx   . Prostate cancer Neg Hx   . Cancer Neg Hx   . Diabetes Neg Hx   . Heart disease Neg Hx     The following portions of the patient's history were reviewed and updated as appropriate: allergies, current medications, past family history, past medical history, past social history, past surgical history and problem list.  Review of Systems Review of Systems  Constitutional:       Hot flashes that started since stopping hormone replacement therapy Associated insomnia  Respiratory: Negative.   Cardiovascular: Negative.   Gastrointestinal: Negative.   Genitourinary: Negative.   Musculoskeletal: Negative.  Skin: Negative.   Neurological: Negative.   Endo/Heme/Allergies: Negative.   Psychiatric/Behavioral: Negative.      Objective:   BP 125/73   Pulse 66   Ht 5\' 4"  (1.626 m)   Wt 189 lb 14.4 oz (86.1 kg)   BMI 32.60 kg/m   CONSTITUTIONAL: Well-developed, well-nourished female in no acute distress.  PSYCHIATRIC: Normal mood and affect. Normal behavior. Normal judgment and thought content. [place]: Alert and oriented to person, place, and time. Normal muscle tone coordination. No cranial nerve deficit noted. HENT:  Normocephalic, atraumatic, External right and left  ear normal.  EYES: Conjunctivae and EOM are normal. No scleral icterus.  NECK: Normal range of motion, supple, no masses.  Normal thyroid.  SKIN: Skin is warm and dry. No rash noted. Not diaphoretic. No erythema. No pallor. CARDIOVASCULAR: Normal heart rate noted, regular rhythm, no murmur. RESPIRATORY: Clear to auscultation bilaterally. Effort and breath sounds normal, no problems with respiration noted. BREASTS: Symmetric in size. No masses, skin changes, nipple drainage, or lymphadenopathy. ABDOMEN: Soft, normal bowel sounds, no distention noted.  No tenderness, rebound or guarding.  BLADDER: Normal PELVIC:  External Genitalia: Normal  BUS: Normal  Vagina: Normal; Fair estrogen effect  Cervix: Normal; No lesions; no cervical motion tenderness  Uterus: Normal; Midplane, normal size and shape, mobile, nontender  Adnexa: Normal; Nonpalpable and nontender  RV: External Exam NormaI, No Rectal Masses and Normal Sphincter tone  MUSCULOSKELETAL: Normal range of motion. No tenderness.  No cyanosis, clubbing, or edema.  2+ distal pulses. LYMPHATIC: No Axillary, Supraclavicular, or Inguinal Adenopathy.    Assessment:   Annual gynecologic examination 61 y.o. Contraception: post menopausal status bmi- 32  Menopausal, asymptomatic On HRT; symptomatic discontinuation of HRT  Plan:  Pap: pap whpv Mammogram: Ordered Stool Guaiac Testing: colonoscopy due this year- will order Labs: thru pcp Routine preventative health maintenance measures emphasized: Exercise/Diet/Weight control, Tobacco Warnings and Alcohol/Substance use risks Encouraged healthy eating and exercise with goal of 12 pound weight loss in 1 year Resume estradiol 1 mg a day Resume Aygestin 5 mg a day Return to [place], CMA  Brayton Mars, MD  Note: This dictation was prepared with Dragon dictation along with smaller phrase technology. Any transcriptional errors that result from this process  are unintentional.

## 2017-01-17 ENCOUNTER — Ambulatory Visit (INDEPENDENT_AMBULATORY_CARE_PROVIDER_SITE_OTHER): Payer: 59 | Admitting: Obstetrics and Gynecology

## 2017-01-17 ENCOUNTER — Encounter: Payer: Self-pay | Admitting: Obstetrics and Gynecology

## 2017-01-17 VITALS — BP 125/73 | HR 66 | Ht 64.0 in | Wt 189.9 lb

## 2017-01-17 DIAGNOSIS — Z01419 Encounter for gynecological examination (general) (routine) without abnormal findings: Secondary | ICD-10-CM | POA: Diagnosis not present

## 2017-01-17 DIAGNOSIS — Z1231 Encounter for screening mammogram for malignant neoplasm of breast: Secondary | ICD-10-CM | POA: Diagnosis not present

## 2017-01-17 DIAGNOSIS — Z1239 Encounter for other screening for malignant neoplasm of breast: Secondary | ICD-10-CM

## 2017-01-17 DIAGNOSIS — Z1211 Encounter for screening for malignant neoplasm of colon: Secondary | ICD-10-CM | POA: Diagnosis not present

## 2017-01-17 DIAGNOSIS — Z780 Asymptomatic menopausal state: Secondary | ICD-10-CM | POA: Diagnosis not present

## 2017-01-17 DIAGNOSIS — N951 Menopausal and female climacteric states: Secondary | ICD-10-CM | POA: Diagnosis not present

## 2017-01-17 DIAGNOSIS — R638 Other symptoms and signs concerning food and fluid intake: Secondary | ICD-10-CM

## 2017-01-17 NOTE — Patient Instructions (Signed)
1. Pap smear is done 2. Mammogram is ordered 3. Colon cancer screening will be done through Dr. wall in GI 4. Continue with calcium and vitamin D supplementation 5. Estradiol 1 mg a day is refilled 6. Norethindrone acetate 5 mg a day is refilled 7. Continue with healthy eating and exercise with controlled weight loss 8. Return in 1 year    Health Maintenance for Postmenopausal Women Introduction Menopause is a normal process in which your reproductive ability comes to an end. This process happens gradually over a span of months to years, usually between the ages of 6 and 72. Menopause is complete when you have missed 12 consecutive menstrual periods. It is important to talk with your health care provider about some of the most common conditions that affect postmenopausal women, such as heart disease, cancer, and bone loss (osteoporosis). Adopting a healthy lifestyle and getting preventive care can help to promote your health and wellness. Those actions can also lower your chances of developing some of these common conditions. What should I know about menopause? During menopause, you may experience a number of symptoms, such as:  Moderate-to-severe hot flashes.  Night sweats.  Decrease in sex drive.  Mood swings.  Headaches.  Tiredness.  Irritability.  Memory problems.  Insomnia. Choosing to treat or not to treat menopausal changes is an individual decision that you make with your health care provider. What should I know about hormone replacement therapy and supplements? Hormone therapy products are effective for treating symptoms that are associated with menopause, such as hot flashes and night sweats. Hormone replacement carries certain risks, especially as you become older. If you are thinking about using estrogen or estrogen with progestin treatments, discuss the benefits and risks with your health care provider. What should I know about heart disease and stroke? Heart  disease, heart attack, and stroke become more likely as you age. This may be due, in part, to the hormonal changes that your body experiences during menopause. These can affect how your body processes dietary fats, triglycerides, and cholesterol. Heart attack and stroke are both medical emergencies. There are many things that you can do to help prevent heart disease and stroke:  Have your blood pressure checked at least every 1-2 years. High blood pressure causes heart disease and increases the risk of stroke.  If you are 26-8 years old, ask your health care provider if you should take aspirin to prevent a heart attack or a stroke.  Do not use any tobacco products, including cigarettes, chewing tobacco, or electronic cigarettes. If you need help quitting, ask your health care provider.  It is important to eat a healthy diet and maintain a healthy weight.  Be sure to include plenty of vegetables, fruits, low-fat dairy products, and lean protein.  Avoid eating foods that are high in solid fats, added sugars, or salt (sodium).  Get regular exercise. This is one of the most important things that you can do for your health.  Try to exercise for at least 150 minutes each week. The type of exercise that you do should increase your heart rate and make you sweat. This is known as moderate-intensity exercise.  Try to do strengthening exercises at least twice each week. Do these in addition to the moderate-intensity exercise.  Know your numbers.Ask your health care provider to check your cholesterol and your blood glucose. Continue to have your blood tested as directed by your health care provider. What should I know about cancer screening? There are several  types of cancer. Take the following steps to reduce your risk and to catch any cancer development as early as possible. Breast Cancer  Practice breast self-awareness.  This means understanding how your breasts normally appear and feel.  It  also means doing regular breast self-exams. Let your health care provider know about any changes, no matter how small.  If you are 92 or older, have a clinician do a breast exam (clinical breast exam or CBE) every year. Depending on your age, family history, and medical history, it may be recommended that you also have a yearly breast X-ray (mammogram).  If you have a family history of breast cancer, talk with your health care provider about genetic screening.  If you are at high risk for breast cancer, talk with your health care provider about having an MRI and a mammogram every year.  Breast cancer (BRCA) gene test is recommended for women who have family members with BRCA-related cancers. Results of the assessment will determine the need for genetic counseling and BRCA1 and for BRCA2 testing. BRCA-related cancers include these types:  Breast. This occurs in males or females.  Ovarian.  Tubal. This may also be called fallopian tube cancer.  Cancer of the abdominal or pelvic lining (peritoneal cancer).  Prostate.  Pancreatic. Cervical, Uterine, and Ovarian Cancer  Your health care provider may recommend that you be screened regularly for cancer of the pelvic organs. These include your ovaries, uterus, and vagina. This screening involves a pelvic exam, which includes checking for microscopic changes to the surface of your cervix (Pap test).  For women ages 21-65, health care providers may recommend a pelvic exam and a Pap test every three years. For women ages 50-65, they may recommend the Pap test and pelvic exam, combined with testing for human papilloma virus (HPV), every five years. Some types of HPV increase your risk of cervical cancer. Testing for HPV may also be done on women of any age who have unclear Pap test results.  Other health care providers may not recommend any screening for nonpregnant women who are considered low risk for pelvic cancer and have no symptoms. Ask your  health care provider if a screening pelvic exam is right for you.  If you have had past treatment for cervical cancer or a condition that could lead to cancer, you need Pap tests and screening for cancer for at least 20 years after your treatment. If Pap tests have been discontinued for you, your risk factors (such as having a new sexual partner) need to be reassessed to determine if you should start having screenings again. Some women have medical problems that increase the chance of getting cervical cancer. In these cases, your health care provider may recommend that you have screening and Pap tests more often.  If you have a family history of uterine cancer or ovarian cancer, talk with your health care provider about genetic screening.  If you have vaginal bleeding after reaching menopause, tell your health care provider.  There are currently no reliable tests available to screen for ovarian cancer. Lung Cancer  Lung cancer screening is recommended for adults 73-76 years old who are at high risk for lung cancer because of a history of smoking. A yearly low-dose CT scan of the lungs is recommended if you:  Currently smoke.  Have a history of at least 30 pack-years of smoking and you currently smoke or have quit within the past 15 years. A pack-year is smoking an average of one  pack of cigarettes per day for one year. Yearly screening should:  Continue until it has been 15 years since you quit.  Stop if you develop a health problem that would prevent you from having lung cancer treatment. Colorectal Cancer  This type of cancer can be detected and can often be prevented.  Routine colorectal cancer screening usually begins at age 42 and continues through age 76.  If you have risk factors for colon cancer, your health care provider may recommend that you be screened at an earlier age.  If you have a family history of colorectal cancer, talk with your health care provider about genetic  screening.  Your health care provider may also recommend using home test kits to check for hidden blood in your stool.  A small camera at the end of a tube can be used to examine your colon directly (sigmoidoscopy or colonoscopy). This is done to check for the earliest forms of colorectal cancer.  Direct examination of the colon should be repeated every 5-10 years until age 59. However, if early forms of precancerous polyps or small growths are found or if you have a family history or genetic risk for colorectal cancer, you may need to be screened more often. Skin Cancer  Check your skin from head to toe regularly.  Monitor any moles. Be sure to tell your health care provider:  About any new moles or changes in moles, especially if there is a change in a mole's shape or color.  If you have a mole that is larger than the size of a pencil eraser.  If any of your family members has a history of skin cancer, especially at a young age, talk with your health care provider about genetic screening.  Always use sunscreen. Apply sunscreen liberally and repeatedly throughout the day.  Whenever you are outside, protect yourself by wearing long sleeves, pants, a wide-brimmed hat, and sunglasses. What should I know about osteoporosis? Osteoporosis is a condition in which bone destruction happens more quickly than new bone creation. After menopause, you may be at an increased risk for osteoporosis. To help prevent osteoporosis or the bone fractures that can happen because of osteoporosis, the following is recommended:  If you are 28-57 years old, get at least 1,000 mg of calcium and at least 600 mg of vitamin D per day.  If you are older than age 49 but younger than age 58, get at least 1,200 mg of calcium and at least 600 mg of vitamin D per day.  If you are older than age 53, get at least 1,200 mg of calcium and at least 800 mg of vitamin D per day. Smoking and excessive alcohol intake increase the  risk of osteoporosis. Eat foods that are rich in calcium and vitamin D, and do weight-bearing exercises several times each week as directed by your health care provider. What should I know about how menopause affects my mental health? Depression may occur at any age, but it is more common as you become older. Common symptoms of depression include:  Low or sad mood.  Changes in sleep patterns.  Changes in appetite or eating patterns.  Feeling an overall lack of motivation or enjoyment of activities that you previously enjoyed.  Frequent crying spells. Talk with your health care provider if you think that you are experiencing depression. What should I know about immunizations? It is important that you get and maintain your immunizations. These include:  Tetanus, diphtheria, and pertussis (Tdap) booster  vaccine.  Influenza every year before the flu season begins.  Pneumonia vaccine.  Shingles vaccine. Your health care provider may also recommend other immunizations. This information is not intended to replace advice given to you by your health care provider. Make sure you discuss any questions you have with your health care provider. Document Released: 01/06/2006 Document Revised: 06/03/2016 Document Reviewed: 08/18/2015  2017 Elsevier

## 2017-01-18 ENCOUNTER — Telehealth: Payer: Self-pay | Admitting: Obstetrics and Gynecology

## 2017-01-18 MED ORDER — ESTRADIOL 1 MG PO TABS: 1.0000 mg | ORAL_TABLET | Freq: Every day | ORAL | 3 refills | Status: DC

## 2017-01-18 MED ORDER — NORETHINDRONE ACETATE 5 MG PO TABS: 5.0000 mg | ORAL_TABLET | Freq: Every day | ORAL | 3 refills | Status: DC

## 2017-01-18 NOTE — Telephone Encounter (Signed)
Patient called stating she was supposed to have HRT meds sent to the [facility] on [address] yesterday but they have not received them yet.Thanks

## 2017-01-18 NOTE — Telephone Encounter (Signed)
Pt aware med erx. 

## 2017-01-20 LAB — PAP IG AND HPV HIGH-RISK
HPV, HIGH-RISK: NEGATIVE
PAP Smear Comment: 0

## 2017-01-23 ENCOUNTER — Telehealth: Payer: Self-pay

## 2017-01-23 ENCOUNTER — Other Ambulatory Visit: Payer: Self-pay

## 2017-01-23 DIAGNOSIS — Z86010 Personal history of colonic polyps: Secondary | ICD-10-CM

## 2017-01-23 NOTE — Telephone Encounter (Signed)
Gastroenterology Pre-Procedure Review  Request Date: 07/04/2017 Requesting Physician: Dr. Allen Norris  PATIENT REVIEW QUESTIONS: The patient responded to the following health history questions as indicated:    1. Are you having any GI issues? no 2. Do you have a personal history of Polyps? no 3. Do you have a family history of Colon Cancer or Polyps? no 4. Diabetes Mellitus? no 5. Joint replacements in the past 12 months?no 6. Major health problems in the past 3 months?no 7. Any artificial heart valves, MVP, or defibrillator?no    MEDICATIONS & ALLERGIES:    Patient reports the following regarding taking any anticoagulation/antiplatelet therapy:   Plavix, Coumadin, Eliquis, Xarelto, Lovenox, Pradaxa, Brilinta, or Effient? no Aspirin? no  Patient confirms/reports the following medications:  Current Outpatient Prescriptions  Medication Sig Dispense Refill  . estradiol (ESTRACE) 1 MG tablet Take 1 tablet (1 mg total) by mouth daily. 90 tablet 3  . norethindrone (AYGESTIN) 5 MG tablet Take 1 tablet (5 mg total) by mouth daily. 90 tablet 3  . TURMERIC CURCUMIN PO Take by mouth.    . Vitamin D, Ergocalciferol, (DRISDOL) 50000 UNITS CAPS capsule TAKE 1 CAPSULE BY MOUTH WEEKLY  1   No current facility-administered medications for this visit.     Patient confirms/reports the following allergies:  No Known Allergies  No orders of the defined types were placed in this encounter.   AUTHORIZATION INFORMATION Primary Insurance: 1D#: Group #:  Secondary Insurance: 1D#: Group #:  SCHEDULE INFORMATION: Date: 07/04/17 Time: Location: [place]

## 2017-02-16 ENCOUNTER — Ambulatory Visit
Admission: RE | Admit: 2017-02-16 | Discharge: 2017-02-16 | Disposition: A | Discharge status: Home / Self Care | Payer: 59 | Source: Ambulatory Visit | Attending: Obstetrics and Gynecology | Admitting: Obstetrics and Gynecology

## 2017-02-16 DIAGNOSIS — Z1231 Encounter for screening mammogram for malignant neoplasm of breast: Secondary | ICD-10-CM | POA: Diagnosis present

## 2017-02-16 DIAGNOSIS — Z1239 Encounter for other screening for malignant neoplasm of breast: Secondary | ICD-10-CM

## 2017-05-15 ENCOUNTER — Encounter: Payer: Self-pay | Admitting: Gastroenterology

## 2017-05-15 ENCOUNTER — Other Ambulatory Visit: Payer: Self-pay

## 2017-05-15 ENCOUNTER — Ambulatory Visit (INDEPENDENT_AMBULATORY_CARE_PROVIDER_SITE_OTHER): Payer: 59 | Admitting: Gastroenterology

## 2017-05-15 VITALS — BP 137/71 | HR 71 | Temp 97.6°F | Ht 64.0 in | Wt 190.0 lb

## 2017-05-15 DIAGNOSIS — R197 Diarrhea, unspecified: Secondary | ICD-10-CM

## 2017-05-15 DIAGNOSIS — R194 Change in bowel habit: Secondary | ICD-10-CM | POA: Diagnosis not present

## 2017-05-15 NOTE — Progress Notes (Signed)
Gastroenterology Consultation  Referring Provider:     Lenard Simmer, MD Primary Care Physician:  Lenard Simmer, MD Primary Gastroenterologist:  Dr. Allen Norris     Reason for Consultation:     Change in bowel habits        HPI:   Pamela Jacobs is a 61 y.o. y/o female referred for consultation & management of Change in bowel habits by Dr. Ronnald Collum, Pamela Sledge, MD.  This patient comes to see me after having a colonoscopy many years ago for screening purposes. At that time the patient had some ileitis found although had no symptoms or signs of Crohn's. The patient was then followed by another gastrologist who did a small bowel follow-through and did not find any signs of Crohn's disease. The patient reports that she's had some change in bowel habits that she thinks started after she took some antibiotic. The patient reports that now she has a bowel movement after eating. There is no report of any unexplained weight loss fevers chills nausea or vomiting. She reports that her stool texture is normal but she has more frequent bowel movements. There is no report of any black stools or bloody stools. The patient also denies any family history of inflammatory bowel disease or colon cancer.  Past Medical History:  Diagnosis Date  . Arthritis   . GERD (gastroesophageal reflux disease)   . Skin cancer     Past Surgical History:  Procedure Laterality Date  . BREAST BIOPSY Left 2000   benign  . CESAREAN SECTION  8527,7824  . DILATION AND CURETTAGE OF UTERUS    . FOOT SURGERY  2012   plate placed  . NASAL SEPTUM SURGERY  1975    Prior to Admission medications   Medication Sig Start Date End Date Taking? Authorizing Provider  estradiol (ESTRACE) 1 MG tablet Take 1 tablet (1 mg total) by mouth daily. 01/18/17  Yes Defrancesco, Pamela Slim, MD  norethindrone (AYGESTIN) 5 MG tablet Take 1 tablet (5 mg total) by mouth daily. 01/18/17  Yes Defrancesco, Pamela Slim, MD  Vitamin D, Ergocalciferol,  (DRISDOL) 50000 UNITS CAPS capsule TAKE 1 CAPSULE BY MOUTH WEEKLY 06/10/15  Yes [provider]  chlorhexidine (PERIDEX) 0.12 % solution  01/05/17   [provider]  diflunisal (DOLOBID) 500 MG TABS tablet  01/05/17   [provider]  TURMERIC CURCUMIN PO Take by mouth.    [provider]    Family History  Problem Relation Age of Onset  . Bladder Cancer Neg Hx   . Kidney cancer Neg Hx   . Prostate cancer Neg Hx   . Cancer Neg Hx   . Diabetes Neg Hx   . Heart disease Neg Hx   . Ovarian cancer Neg Hx      Social History  Substance Use Topics  . Smoking status: Never Smoker  . Smokeless tobacco: Never Used  . Alcohol use 0.0 oz/week     Comment: occasionaly    Allergies as of 05/15/2017  . (No Known Allergies)    Review of Systems:    All systems reviewed and negative except where noted in HPI.   Physical Exam:  BP 137/71   Pulse 71   Temp 97.6 F (36.4 C) (Oral)   Ht 5\' 4"  (1.626 m)   Wt 190 lb (86.2 kg)   BMI 32.61 kg/m  No LMP recorded. Patient is postmenopausal. Psych:  Alert and cooperative. Normal mood and affect. General:   Alert,  Well-developed, well-nourished, pleasant and cooperative in NAD Head:  Normocephalic and atraumatic. Eyes:  Sclera clear, no icterus.   Conjunctiva pink. Ears:  Normal auditory acuity. Nose:  No deformity, discharge, or lesions. Mouth:  No deformity or lesions,oropharynx pink & moist. Neck:  Supple; no masses or thyromegaly. Lungs:  Respirations even and unlabored.  Clear throughout to auscultation.   No wheezes, crackles, or rhonchi. No acute distress. Heart:  Regular rate and rhythm; no murmurs, clicks, rubs, or gallops. Abdomen:  Normal bowel sounds.  No bruits.  Soft, non-tender and non-distended without masses, hepatosplenomegaly or hernias noted.  No guarding or rebound tenderness.  Negative Carnett sign.   Rectal:  Deferred.  Msk:  Symmetrical without gross deformities.  Good, equal movement &  strength bilaterally. Pulses:  Normal pulses noted. Extremities:  No clubbing or edema.  No cyanosis. Neurologic:  Alert and oriented x3;  grossly normal neurologically. Skin:  Intact without significant lesions or rashes.  No jaundice. Lymph Nodes:  No significant cervical adenopathy. Psych:  Alert and cooperative. Normal mood and affect.  Imaging Studies: No results found.  Assessment and Plan:   Pamela Jacobs is a 61 y.o. y/o female who had some ileitis on her colonoscopy 10 years ago that was nonspecific. The patient has been doing well and had a small bowel follow-through in 2014 that did not show any signs of Crohn's disease. The patient will have her blood sent off for a IBD panel. The patient will also have her colonoscopy for screening purposes in August. 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 The patient has been started on probiotics to see if this helps her symptoms. The patient has been explained the plan and agrees with it.  Lucilla Lame, MD. Marval Regal   Note: This dictation was prepared with Dragon dictation along with smaller phrase technology. Any transcriptional errors that result from this process are unintentional.

## 2017-05-15 NOTE — Progress Notes (Signed)
Screening

## 2017-05-16 LAB — IBD EXPANDED PANEL
ACCA: 18 units (ref 0–90)
ALCA: 20 units (ref 0–60)
AMCA: 138 units — ABNORMAL HIGH (ref 0–100)
Atypical pANCA: NEGATIVE
gASCA: 9 units (ref 0–50)

## 2017-05-18 ENCOUNTER — Telehealth: Payer: Self-pay

## 2017-05-18 NOTE — Telephone Encounter (Signed)
Pt notified of lab results

## 2017-05-18 NOTE — Telephone Encounter (Signed)
-----   Message from Lucilla Lame, MD sent at 05/16/2017  4:47 PM EDT ----- That the patient know that the test was suggestive of crohns but not definite. Will biopsy during colonoscopy.

## 2017-06-19 ENCOUNTER — Other Ambulatory Visit: Payer: Self-pay

## 2017-06-19 DIAGNOSIS — Z1211 Encounter for screening for malignant neoplasm of colon: Secondary | ICD-10-CM

## 2017-07-03 ENCOUNTER — Encounter: Payer: Self-pay | Admitting: *Deleted

## 2017-07-04 ENCOUNTER — Ambulatory Visit: Payer: 59 | Admitting: Anesthesiology

## 2017-07-04 ENCOUNTER — Ambulatory Visit
Admission: RE | Admit: 2017-07-04 | Discharge: 2017-07-04 | Disposition: A | Discharge status: Home / Self Care | Payer: 59 | Source: Ambulatory Visit | Attending: Gastroenterology | Admitting: Gastroenterology

## 2017-07-04 ENCOUNTER — Encounter: Payer: Self-pay | Admitting: Anesthesiology

## 2017-07-04 ENCOUNTER — Encounter
Admission: RE | Disposition: A | Discharge status: Home / Self Care | Payer: Self-pay | Source: Ambulatory Visit | Attending: Gastroenterology

## 2017-07-04 DIAGNOSIS — E669 Obesity, unspecified: Secondary | ICD-10-CM | POA: Insufficient documentation

## 2017-07-04 DIAGNOSIS — Z79899 Other long term (current) drug therapy: Secondary | ICD-10-CM | POA: Diagnosis not present

## 2017-07-04 DIAGNOSIS — K219 Gastro-esophageal reflux disease without esophagitis: Secondary | ICD-10-CM | POA: Insufficient documentation

## 2017-07-04 DIAGNOSIS — Z85828 Personal history of other malignant neoplasm of skin: Secondary | ICD-10-CM | POA: Diagnosis not present

## 2017-07-04 DIAGNOSIS — M1990 Unspecified osteoarthritis, unspecified site: Secondary | ICD-10-CM | POA: Insufficient documentation

## 2017-07-04 DIAGNOSIS — Z6832 Body mass index (BMI) 32.0-32.9, adult: Secondary | ICD-10-CM | POA: Diagnosis not present

## 2017-07-04 DIAGNOSIS — Z1211 Encounter for screening for malignant neoplasm of colon: Secondary | ICD-10-CM

## 2017-07-04 HISTORY — PX: COLONOSCOPY WITH PROPOFOL: SHX5780

## 2017-07-04 SURGERY — COLONOSCOPY WITH PROPOFOL
Anesthesia: General

## 2017-07-04 MED ORDER — SODIUM CHLORIDE 0.9 % IV SOLN
INTRAVENOUS | Status: DC
  Administered 2017-07-04: 08:00:00 via INTRAVENOUS

## 2017-07-04 MED ORDER — PROPOFOL 10 MG/ML IV BOLUS
INTRAVENOUS | Status: DC | PRN
  Administered 2017-07-04: 40 mg via INTRAVENOUS
  Administered 2017-07-04 (×2): 30 mg via INTRAVENOUS
  Administered 2017-07-04: 50 mg via INTRAVENOUS

## 2017-07-04 MED ORDER — PROPOFOL 500 MG/50ML IV EMUL
INTRAVENOUS | Status: DC | PRN
  Administered 2017-07-04: 150 ug/kg/min via INTRAVENOUS

## 2017-07-04 MED ORDER — LIDOCAINE HCL (CARDIAC) 20 MG/ML IV SOLN
INTRAVENOUS | Status: DC | PRN
  Administered 2017-07-04: 40 mg via INTRAVENOUS

## 2017-07-04 NOTE — Anesthesia Procedure Notes (Signed)
Date/Time: 07/04/2017 8:06 AM Performed by: Darlyne Russian Pre-anesthesia Checklist: Patient identified, Emergency Drugs available, Suction available, Patient being monitored and Timeout performed Patient Re-evaluated:Patient Re-evaluated prior to induction Oxygen Delivery Method: Nasal cannula Placement Confirmation: positive ETCO2

## 2017-07-04 NOTE — Anesthesia Preprocedure Evaluation (Signed)
Anesthesia Evaluation  Patient identified by MRN, date of birth, ID band Patient awake    Reviewed: Allergy & Precautions, NPO status , Patient's Chart, lab work & pertinent test results  Airway Mallampati: III  TM Distance: <3 FB     Dental   Pulmonary neg pulmonary ROS,    Pulmonary exam normal        Cardiovascular negative cardio ROS Normal cardiovascular exam     Neuro/Psych negative neurological ROS  negative psych ROS   GI/Hepatic Neg liver ROS, GERD  Medicated,  Endo/Other  negative endocrine ROS  Renal/GU negative Renal ROS     Musculoskeletal  (+) Arthritis , Osteoarthritis,    Abdominal (+) + obese,   Peds negative pediatric ROS (+)  Hematology negative hematology ROS (+)   Anesthesia Other Findings   Reproductive/Obstetrics                             Anesthesia Physical Anesthesia Plan  ASA: II  Anesthesia Plan: General   Post-op Pain Management:    Induction: Intravenous  PONV Risk Score and Plan:   Airway Management Planned: Nasal Cannula  Additional Equipment:   Intra-op Plan:   Post-operative Plan:   Informed Consent: I have reviewed the patients History and Physical, chart, labs and discussed the procedure including the risks, benefits and alternatives for the proposed anesthesia with the patient or authorized representative who has indicated his/her understanding and acceptance.   Dental advisory given  Plan Discussed with: CRNA and Surgeon  Anesthesia Plan Comments:         Anesthesia Quick Evaluation

## 2017-07-04 NOTE — Op Note (Signed)
[facility] Gastroenterology Patient Name: Pamela Jacobs Procedure Date: 07/04/2017 7:51 AM MRN: [id number] Account #: [id number] Date of Birth: [birth year] Admit Type: Outpatient Age: 61 Room: [facility] ENDO ROOM 4 Gender: Female Note Status: Finalized Procedure:            Colonoscopy Indications:          Screening for colorectal malignant neoplasm Providers:            Lucilla Lame MD, MD Referring MD:         Lenard Simmer, MD (Referring MD) Medicines:            Propofol per Anesthesia Complications:        No immediate complications. Procedure:            Pre-Anesthesia Assessment:                       - Prior to the procedure, a History and Physical was                        performed, and patient medications and allergies were                        reviewed. The patient's tolerance of previous                        anesthesia was also reviewed. The risks and benefits of                        the procedure and the sedation options and risks were                        discussed with the patient. All questions were                        answered, and informed consent was obtained. Prior                        Anticoagulants: The patient has taken no previous                        anticoagulant or antiplatelet agents. ASA Grade                        Assessment: II - A patient with mild systemic disease.                        After reviewing the risks and benefits, the patient was                        deemed in satisfactory condition to undergo the                        procedure.                       After obtaining informed consent, the colonoscope was                        passed under direct vision. Throughout the procedure,  the patient's blood pressure, pulse, and oxygen                        saturations were monitored continuously. The                        Colonoscope was introduced through the anus and            advanced to the the terminal ileum. The colonoscopy was                        performed without difficulty. The patient tolerated the                        procedure well. The quality of the bowel preparation                        was excellent. Findings:      The perianal and digital rectal examinations were normal.      The terminal ileum appeared normal. Biopsies were taken with a cold       forceps for histology.      The colon (entire examined portion) appeared normal. Biopsies were taken       with a cold forceps for histology. Impression:           - The examined portion of the ileum was normal.                        Biopsied.                       - The entire examined colon is normal. Biopsied. Recommendation:       - Discharge patient to home.                       - Resume previous diet.                       - Continue present medications.                       - Await pathology results. Procedure Code(s):    --- Professional ---                       [phone], Colonoscopy, flexible; with biopsy, single or                        multiple Diagnosis Code(s):    --- Professional ---                       Z12.11, Encounter for screening for malignant neoplasm                        of colon CPT copyright 2016 [employer]. All rights reserved. The codes documented in this report are preliminary and upon coder review may  be revised to meet current compliance requirements. Lucilla Lame MD, MD 07/04/2017 8:29:13 AM This report has been signed electronically. Number of Addenda: 0 Note Initiated On: 07/04/2017 7:51 AM Scope Withdrawal Time: 0 hours 8 minutes 55 seconds  Total Procedure Duration: 0 hours 14 minutes 44 seconds  [facility]

## 2017-07-04 NOTE — Anesthesia Post-op Follow-up Note (Signed)
Anesthesia QCDR form completed.        

## 2017-07-04 NOTE — H&P (Signed)
   Lucilla Lame, MD [facility] [address], [place], [place] [address] Phone: [phone] Fax : [phone]  Primary Care Physician:  Lenard Simmer, MD Primary Gastroenterologist:  Dr. Allen Norris  Pre-Procedure History & Physical: HPI:  Pamela Jacobs is a 61 y.o. female is here for a screening colonoscopy.   Past Medical History:  Diagnosis Date  . Arthritis   . GERD (gastroesophageal reflux disease)   . Skin cancer     Past Surgical History:  Procedure Laterality Date  . BREAST BIOPSY Left 2000   benign  . CESAREAN SECTION  4008,6761  . DILATION AND CURETTAGE OF UTERUS    . FOOT SURGERY  2012   plate placed  . NASAL SEPTUM SURGERY  1975  . TUBAL LIGATION      Prior to Admission medications   Medication Sig Start Date End Date Taking? Authorizing Provider  estradiol (ESTRACE) 1 MG tablet Take 1 tablet (1 mg total) by mouth daily. 01/18/17  Yes Defrancesco, Alanda Slim, MD  norethindrone (AYGESTIN) 5 MG tablet Take 1 tablet (5 mg total) by mouth daily. 01/18/17  Yes Defrancesco, Alanda Slim, MD  chlorhexidine (PERIDEX) 0.12 % solution  01/05/17   [provider]  diflunisal (DOLOBID) 500 MG TABS tablet  01/05/17   [provider]  TURMERIC CURCUMIN PO Take by mouth.    [provider]  Vitamin D, Ergocalciferol, (DRISDOL) 50000 UNITS CAPS capsule TAKE 1 CAPSULE BY MOUTH WEEKLY 06/10/15   [provider]    Allergies as of 01/23/2017  . (No Known Allergies)    Family History  Problem Relation Age of Onset  . Bladder Cancer Neg Hx   . Kidney cancer Neg Hx   . Prostate cancer Neg Hx   . Cancer Neg Hx   . Diabetes Neg Hx   . Heart disease Neg Hx   . Ovarian cancer Neg Hx     Social History   Social History  . Marital status: Married    Spouse name: N/A  . Number of children: N/A  . Years of education: N/A   Occupational History  . Not on file.   Social History Main Topics  . Smoking status: Never Smoker  . Smokeless tobacco:  Never Used  . Alcohol use 0.0 oz/week     Comment: occasionaly  . Drug use: No  . Sexual activity: Yes    Birth control/ protection: Post-menopausal   Other Topics Concern  . Not on file   Social History Narrative  . No narrative on file    Review of Systems: See HPI, otherwise negative ROS  Physical Exam: BP 116/78   Pulse 67   Temp 97.8 F (36.6 C) (Tympanic)   Resp 18   Ht 5\' 4"  (1.626 m)   Wt 188 lb (85.3 kg)   SpO2 98%   BMI 32.27 kg/m  General:   Alert,  pleasant and cooperative in NAD Head:  Normocephalic and atraumatic. Neck:  Supple; no masses or thyromegaly. Lungs:  Clear throughout to auscultation.    Heart:  Regular rate and rhythm. Abdomen:  Soft, nontender and nondistended. Normal bowel sounds, without guarding, and without rebound.   Neurologic:  Alert and  oriented x4;  grossly normal neurologically.  Impression/Plan: Pamela Jacobs is now here to undergo a screening colonoscopy.  Risks, benefits, and alternatives regarding colonoscopy have been reviewed with the patient.  Questions have been answered.  All parties agreeable.

## 2017-07-04 NOTE — Anesthesia Postprocedure Evaluation (Signed)
Anesthesia Post Note  Patient: Pamela Jacobs  Procedure(s) Performed: Procedure(s) (LRB): COLONOSCOPY WITH PROPOFOL (N/A)  Patient location during evaluation: PACU Anesthesia Type: General Level of consciousness: awake and alert and oriented Pain management: pain level controlled Vital Signs Assessment: post-procedure vital signs reviewed and stable Respiratory status: spontaneous breathing Cardiovascular status: blood pressure returned to baseline Anesthetic complications: no     Last Vitals:  Vitals:   07/04/17 0850 07/04/17 0900  BP: 126/83 108/70  Pulse: (!) 55 (!) 52  Resp: 13 11  Temp:      Last Pain:  Vitals:   07/04/17 0830  TempSrc: Tympanic                 Hercules Hasler

## 2017-07-04 NOTE — Transfer of Care (Signed)
Immediate Anesthesia Transfer of Care Note  Patient: Pamela Jacobs  Procedure(s) Performed: Procedure(s): COLONOSCOPY WITH PROPOFOL (N/A)  Patient Location: PACU  Anesthesia Type:General  Level of Consciousness: awake, alert , oriented and patient cooperative  Airway & Oxygen Therapy: Patient Spontanous Breathing and Patient connected to nasal cannula oxygen  Post-op Assessment: Report given to RN and Post -op Vital signs reviewed and stable  Post vital signs: Reviewed and stable  Last Vitals:  Vitals:   07/04/17 0740 07/04/17 0830  BP: 116/78 (!) 93/54  Pulse: 67 65  Resp: 18 20  Temp: 36.6 C (!) 36.2 C    Last Pain:  Vitals:   07/04/17 0830  TempSrc: Tympanic         Complications: No apparent anesthesia complications

## 2017-07-05 ENCOUNTER — Encounter: Payer: Self-pay | Admitting: Gastroenterology

## 2017-07-05 LAB — SURGICAL PATHOLOGY

## 2017-07-11 ENCOUNTER — Encounter: Payer: Self-pay | Admitting: Gastroenterology

## 2017-07-13 ENCOUNTER — Telehealth: Payer: Self-pay | Admitting: Gastroenterology

## 2017-07-13 NOTE — Telephone Encounter (Signed)
Patient LVM that she would like her biopsy results.

## 2017-07-13 NOTE — Telephone Encounter (Signed)
Patient called back a second time and would like to talk to you regarding results. Please call today

## 2017-07-13 NOTE — Telephone Encounter (Signed)
Pt notified of colonoscopy results.  

## 2017-10-26 ENCOUNTER — Telehealth: Payer: Self-pay | Admitting: Podiatry

## 2017-10-26 NOTE — Telephone Encounter (Signed)
Pt left voicemail that she was looking to get a new pair of orthotics made and needs an appt with Dr Paulla Dolly.

## 2017-10-30 ENCOUNTER — Other Ambulatory Visit: Payer: Self-pay | Admitting: Podiatry

## 2017-10-30 ENCOUNTER — Encounter: Payer: Self-pay | Admitting: Podiatry

## 2017-10-30 ENCOUNTER — Ambulatory Visit: Payer: 59 | Admitting: Podiatry

## 2017-10-30 ENCOUNTER — Ambulatory Visit (INDEPENDENT_AMBULATORY_CARE_PROVIDER_SITE_OTHER): Payer: 59

## 2017-10-30 ENCOUNTER — Ambulatory Visit (INDEPENDENT_AMBULATORY_CARE_PROVIDER_SITE_OTHER): Payer: 59 | Admitting: Orthotics

## 2017-10-30 DIAGNOSIS — M79672 Pain in left foot: Secondary | ICD-10-CM

## 2017-10-30 DIAGNOSIS — M2010 Hallux valgus (acquired), unspecified foot: Secondary | ICD-10-CM

## 2017-10-30 DIAGNOSIS — M779 Enthesopathy, unspecified: Secondary | ICD-10-CM

## 2017-10-30 DIAGNOSIS — M79671 Pain in right foot: Secondary | ICD-10-CM

## 2017-10-30 MED ORDER — TRIAMCINOLONE ACETONIDE 10 MG/ML IJ SUSP
10.0000 mg | Freq: Once | INTRAMUSCULAR | Status: AC
  Administered 2017-10-30: 10 mg

## 2017-10-30 NOTE — Progress Notes (Signed)
Patient seen today for f/o to address supination/capsulitis.  Plan on dress orthotic sulcus length w/ 4* valgus RF skive

## 2017-10-30 NOTE — Progress Notes (Signed)
Subjective:   Patient ID: Pamela Jacobs, female   DOB: 61 y.o.   MRN: [id number]   HPI Patient presents stating that she's having a lot of pain in her left ankle and the top of the left foot and the top of the right foot we did surgery on is doing better but still gives her some discomfort   ROS      Objective:  Physical Exam  0 neurovascular status intact with patient noted to have exquisite discomfort in the sinus tarsi left with fluid buildup and mild discomfort in the dorsum of the left over right foot with tendinitis-like symptoms and possibility for arthritis     Assessment:  Acute sinus tarsitis left with inflammation and dorsal tendinitis left over right with probable arthritis     Plan:  H&P condition reviewed and today I injected the sinus tarsi left 3 Milligan Kenalog 5 mill grams Xylocaine discussed orthotics and patient will have new orthotics made to provide for plantar support. Reappoint for [place] to recheck  X-rays indicate a plates in place of the midfoot right with good alignment noted and the left shows probable arthritis in the mid foot with no indications of ankle arthritis currently

## 2017-11-27 ENCOUNTER — Encounter: Payer: Self-pay | Admitting: Podiatry

## 2017-11-27 ENCOUNTER — Ambulatory Visit: Payer: 59 | Admitting: Podiatry

## 2017-11-27 DIAGNOSIS — M779 Enthesopathy, unspecified: Secondary | ICD-10-CM

## 2017-11-27 DIAGNOSIS — M79672 Pain in left foot: Secondary | ICD-10-CM

## 2017-11-27 DIAGNOSIS — M79671 Pain in right foot: Secondary | ICD-10-CM

## 2017-11-27 MED ORDER — TRIAMCINOLONE ACETONIDE 10 MG/ML IJ SUSP
10.0000 mg | Freq: Once | INTRAMUSCULAR | Status: AC
  Administered 2017-11-27: 10 mg

## 2017-11-27 NOTE — Patient Instructions (Signed)

## 2017-11-27 NOTE — Progress Notes (Signed)
Subjective:   Patient ID: Pamela Jacobs, female   DOB: 61 y.o.   MRN: [id number]   HPI Patient presents stating that the left foot has still been bothering her on top more in the ankle and the top of the foot   ROS      Objective:  Physical Exam  Neurovascular status intact with dorsal discomfort left foot within the ankle and extending plantar     Assessment:  Tendinitis left with inflammation     Plan:  Dorsal injection administered and discussed that MRI might be necessary if symptoms were to persist

## 2018-01-19 NOTE — Progress Notes (Signed)
Patient ID: Pamela Jacobs, female   DOB: 1956/05/31, 62 y.o.   MRN: [id number] ANNUAL PREVENTATIVE CARE GYN  ENCOUNTER NOTE  Subjective:       Pamela Jacobs is a 62 y.o. G3 P2012 . female here for a routine annual gynecologic exam.  Current complaints: 1.? Continue hrt??  Patient has been on hormone replacement therapy for 12 years. She desires to continue with hormone replacement therapy because of the hot flashes experienced since the discontinuation of estradiol and Aygestin.  She has experienced recent pelvic cramping without vaginal bleeding. Current dosage is estradiol 1 mg a day and 2.5 mg a day (1/2 tablet of 5 mg tabs)  Recent colonoscopy was normal Bladder function is normal. Patient has gotten the shingles vaccine 2 years ago. Gynecologic History No LMP recorded. Patient is postmenopausal. Contraception: post menopausal status Last Pap: 01/18/2017 neg.neg. Results were: normal Last mammogram: 02/16/2017 birad 1. Results were: normal History of hysteroscopy/D&C for abnormal uterine bleeding; pathology-polyps No vasomotor symptoms on HRT therapy  Obstetric History OB History  Gravida Para Term Preterm AB Living  3 2 2   1 2   SAB TAB Ectopic Multiple Live Births  1       2    # Outcome Date GA Lbr Len/2nd Weight Sex Delivery Anes PTL Lv  3 Term 1994   6 lb 3.2 oz (2.812 kg) M CS-LTranv   LIV  2 Term 1988   6 lb 12.8 oz (3.084 kg) M CS-LTranv   LIV  1 SAB 1986              Past Medical History:  Diagnosis Date  . Arthritis   . GERD (gastroesophageal reflux disease)   . Skin cancer     Past Surgical History:  Procedure Laterality Date  . BREAST BIOPSY Left 2000   benign  . CESAREAN SECTION  [id number]  . COLONOSCOPY WITH PROPOFOL N/A 07/04/2017   Procedure: COLONOSCOPY WITH PROPOFOL;  Surgeon: Lucilla Lame, MD;  Location: [facility] ENDOSCOPY;  Service: Endoscopy;  Laterality: N/A;  . DILATION AND CURETTAGE OF UTERUS    . FOOT SURGERY  2012   plate placed  . NASAL  SEPTUM SURGERY  1975  . TUBAL LIGATION      Current Outpatient Medications on File Prior to Visit  Medication Sig Dispense Refill  . chlorhexidine (PERIDEX) 0.12 % solution     . diflunisal (DOLOBID) 500 MG TABS tablet     . estradiol (ESTRACE) 1 MG tablet Take 1 tablet (1 mg total) by mouth daily. 90 tablet 3  . norethindrone (AYGESTIN) 5 MG tablet Take 1 tablet (5 mg total) by mouth daily. 90 tablet 3  . TURMERIC CURCUMIN PO Take by mouth.    . Vitamin D, Ergocalciferol, (DRISDOL) 50000 UNITS CAPS capsule TAKE 1 CAPSULE BY MOUTH WEEKLY  1   No current facility-administered medications on file prior to visit.     No Known Allergies  Social History   Socioeconomic History  . Marital status: Married    Spouse name: Not on file  . Number of children: Not on file  . Years of education: Not on file  . Highest education level: Not on file  Social Needs  . Financial resource strain: Not on file  . Food insecurity - worry: Not on file  . Food insecurity - inability: Not on file  . Transportation needs - medical: Not on file  . Transportation needs - non-medical: Not on file  Occupational History  .  Not on file  Tobacco Use  . Smoking status: Never Smoker  . Smokeless tobacco: Never Used  Substance and Sexual Activity  . Alcohol use: Yes    Alcohol/week: 0.0 oz    Comment: occasionaly  . Drug use: No  . Sexual activity: Yes    Birth control/protection: Post-menopausal  Other Topics Concern  . Not on file  Social History Narrative  . Not on file    Family History  Problem Relation Age of Onset  . Bladder Cancer Neg Hx   . Kidney cancer Neg Hx   . Prostate cancer Neg Hx   . Cancer Neg Hx   . Diabetes Neg Hx   . Heart disease Neg Hx   . Ovarian cancer Neg Hx     The following portions of the patient's history were reviewed and updated as appropriate: allergies, current medications, past family history, past medical history, past social history, past surgical history  and problem list.  Review of Systems Review of Systems  Constitutional: Negative.   Eyes: Negative.   Respiratory: Negative.   Cardiovascular: Negative.   Gastrointestinal: Negative.        2018 colonoscopy normal  Genitourinary: Negative.   Musculoskeletal: Negative.   Skin: Negative.        Skin lesion recently froze on the upper extremity; sees dermatology twice a year  Neurological: Negative.   Endo/Heme/Allergies: Negative.   Psychiatric/Behavioral: Negative.       Objective:   BP 130/83   Pulse 73   Ht 5\' 4"  (1.626 m)   Wt 185 lb 14.4 oz (84.3 kg)   BMI 31.91 kg/m   CONSTITUTIONAL: Well-developed, well-nourished female in no acute distress.  PSYCHIATRIC: Normal mood and affect. Normal behavior. Normal judgment and thought content. [place]: Alert and oriented to person, place, and time. Normal muscle tone coordination. No cranial nerve deficit noted. HENT:  Normocephalic, atraumatic, External right and left ear normal.  EYES: Conjunctivae and EOM are normal. No scleral icterus.  NECK: Normal range of motion, supple, no masses.  Normal thyroid.  SKIN: Skin is warm and dry. No rash noted. Not diaphoretic. No erythema. No pallor. CARDIOVASCULAR: Normal heart rate noted, regular rhythm, no murmur. RESPIRATORY: Clear to auscultation bilaterally. Effort and breath sounds normal, no problems with respiration noted. BREASTS: Symmetric in size. No masses, skin changes, nipple drainage, or lymphadenopathy.  Left breast-upper outer quadrant semilunar scar, well-healed ABDOMEN: Soft, normal bowel sounds, no distention noted.  No tenderness, rebound or guarding.  BLADDER: Normal PELVIC:  External Genitalia: Normal  BUS: Normal  Vagina: Normal; Fair estrogen effect  Cervix: Normal; No lesions; no cervical motion tenderness; no Pap smear taken  Uterus: Normal; Midplane, normal size and shape, mobile, nontender  Adnexa: Normal; Nonpalpable and nontender  RV: External Exam  NormaI, No Rectal Masses and Normal Sphincter tone  MUSCULOSKELETAL: Normal range of motion. No tenderness.  No cyanosis, clubbing, or edema.  2+ distal pulses. LYMPHATIC: No Axillary, Supraclavicular, or Inguinal Adenopathy.    Assessment:   Annual gynecologic examination 62 y.o. Contraception: post menopausal status bmi- 31 Menopausal, asymptomatic On HRT; symptomatic discontinuation of HRT in 2018; wishes to continue-decreased dosage  Plan:  Pap: due 2021 mammogram: Ordered  Stool Guaiac Testing: 07/04/17 colonoscopy Labs: thru pcp Routine preventative health maintenance measures emphasized: Exercise/Diet/Weight control, Tobacco Warnings and Alcohol/Substance use risks Encouraged healthy eating and exercise with goal of 12 pound weight loss in 1 year Resume estradiol 0.5 mg a day Resume Aygestin 5 mg a day (  one half tab daily) Return to [place] 61-already obtained at age [address], CMA  Brayton Mars, MD   Note: This dictation was prepared with Dragon dictation along with smaller phrase technology. Any transcriptional errors that result from this process are unintentional.

## 2018-01-24 ENCOUNTER — Encounter: Payer: Self-pay | Admitting: Obstetrics and Gynecology

## 2018-01-24 ENCOUNTER — Ambulatory Visit (INDEPENDENT_AMBULATORY_CARE_PROVIDER_SITE_OTHER): Payer: 59 | Admitting: Obstetrics and Gynecology

## 2018-01-24 VITALS — BP 130/83 | HR 73 | Ht 64.0 in | Wt 185.9 lb

## 2018-01-24 DIAGNOSIS — N951 Menopausal and female climacteric states: Secondary | ICD-10-CM

## 2018-01-24 DIAGNOSIS — Z01419 Encounter for gynecological examination (general) (routine) without abnormal findings: Secondary | ICD-10-CM | POA: Diagnosis not present

## 2018-01-24 DIAGNOSIS — Z1211 Encounter for screening for malignant neoplasm of colon: Secondary | ICD-10-CM

## 2018-01-24 DIAGNOSIS — Z1231 Encounter for screening mammogram for malignant neoplasm of breast: Secondary | ICD-10-CM

## 2018-01-24 DIAGNOSIS — Z780 Asymptomatic menopausal state: Secondary | ICD-10-CM | POA: Diagnosis not present

## 2018-01-24 DIAGNOSIS — R638 Other symptoms and signs concerning food and fluid intake: Secondary | ICD-10-CM

## 2018-01-24 DIAGNOSIS — Z1239 Encounter for other screening for malignant neoplasm of breast: Secondary | ICD-10-CM

## 2018-01-24 MED ORDER — ESTRADIOL 1 MG PO TABS: 1.0000 mg | ORAL_TABLET | Freq: Every day | ORAL | 3 refills | Status: DC

## 2018-01-24 MED ORDER — NORETHINDRONE ACETATE 5 MG PO TABS: 5.0000 mg | ORAL_TABLET | Freq: Every day | ORAL | 3 refills | Status: DC

## 2018-01-24 NOTE — Patient Instructions (Addendum)
1.  No Pap smear is done.  Next Pap is due in 2021 2.  Mammogram ordered 3.  No stool guaiac card testing is ordered because of recent colonoscopy 07/04/2017 4.  Screening labs are to be obtained through primary care 5.  Continue with healthy eating and exercise 6.  Continue with calcium and vitamin D supplementation 7.  Continue with hormone replacement therapy 8.  Estradiol 1 mg a day is refilled (take 1/2 tablet daily) 9.  Aygestin 5 mg a day is refilled (take 1/2 tablet daily) 10.  Return in 1 year for annual exam 11.  Shingles vaccine-age 62-already obtained  Recombinant Zoster (Shingles) Vaccine, RZV: What You Need to Know 1. Why get vaccinated? Shingles (also called herpes zoster, or just zoster) is a painful skin rash, often with blisters. Shingles is caused by the varicella zoster virus, the same virus that causes chickenpox. After you have chickenpox, the virus stays in your body and can cause shingles later in life. You can't catch shingles from another person. However, a person who has never had chickenpox (or chickenpox vaccine) could get chickenpox from someone with shingles. A shingles rash usually appears on one side of the face or body and heals within 2 to 4 weeks. Its main symptom is pain, which can be severe. Other symptoms can include fever, headache, chills and upset stomach. Very rarely, a shingles infection can lead to pneumonia, hearing problems, blindness, brain inflammation (encephalitis), or death. For about 1 person in 5, severe pain can continue even long after the rash has cleared up. This long-lasting pain is called post-herpetic neuralgia (PHN). Shingles is far more common in people 65 years of age and older than in younger people, and the risk increases with age. It is also more common in people whose immune system is weakened because of a disease such as cancer, or by drugs such as steroids or chemotherapy. At least 1 million people a year in the [place] get  shingles. 2. Shingles vaccine (recombinant) Recombinant shingles vaccine was approved by FDA in 2017 for the prevention of shingles. In clinical trials, it was more than 90% effective in preventing shingles. It can also reduce the likelihood of PHN. Two doses, 2 to 6 months apart, are recommended for adults 55 and older. This vaccine is also recommended for people who have already gotten the live shingles vaccine (Zostavax). There is no live virus in this vaccine. 3. Some people should not get this vaccine Tell your vaccine provider if you:  Have any severe, life-threatening allergies. A person who has ever had a life-threatening allergic reaction after a dose of recombinant shingles vaccine, or has a severe allergy to any component of this vaccine, may be advised not to be vaccinated. Ask your health care provider if you want information about vaccine components.  Are pregnant or breastfeeding. There is not much information about use of recombinant shingles vaccine in pregnant or nursing women. Your healthcare provider might recommend delaying vaccination.  Are not feeling well. If you have a mild illness, such as a cold, you can probably get the vaccine today. If you are moderately or severely ill, you should probably wait until you recover. Your doctor can advise you.  4. Risks of a vaccine reaction With any medicine, including vaccines, there is a chance of reactions. After recombinant shingles vaccination, a person might experience:  Pain, redness, soreness, or swelling at the site of the injection  Headache, muscle aches, fever, shivering, fatigue  In  clinical trials, most people got a sore arm with mild or moderate pain after vaccination, and some also had redness and swelling where they got the shot. Some people felt tired, had muscle pain, a headache, shivering, fever, stomach pain, or nausea. About 1 out of 6 people who got recombinant zoster vaccine experienced side effects that  prevented them from doing regular activities. Symptoms went away on their own in about 2 to 3 days. Side effects were more common in younger people. You should still get the second dose of recombinant zoster vaccine even if you had one of these reactions after the first dose. Other things that could happen after this vaccine:  People sometimes faint after medical procedures, including vaccination. Sitting or lying down for about 15 minutes can help prevent fainting and injuries caused by a fall. Tell your provider if you feel dizzy or have vision changes or ringing in the ears.  Some people get shoulder pain that can be more severe and longer-lasting than routine soreness that can follow injections. This happens very rarely.  Any medication can cause a severe allergic reaction. Such reactions to a vaccine are estimated at about 1 in a million doses, and would happen within a few minutes to a few hours after the vaccination. As with any medicine, there is a very remote chance of a vaccine causing a serious injury or death. The safety of vaccines is always being monitored. For more information, visit: http://www.aguilar.org/ 5. What if there is a serious problem? What should I look for?  Look for anything that concerns you, such as signs of a severe allergic reaction, very high fever, or unusual behavior. Signs of a severe allergic reaction can include hives, swelling of the face and throat, difficulty breathing, a fast heartbeat, dizziness, and weakness. These would usually start a few minutes to a few hours after the vaccination. What should I do?  If you think it is a severe allergic reaction or other emergency that can't wait, call 9-1-1 and get to the nearest hospital. Otherwise, call your health care provider. Afterward, the reaction should be reported to the Vaccine Adverse Event Reporting System (VAERS). Your doctor should file this report, or you can do it yourself through the VAERS web  site atwww.vaers.https://www.bray.com/ by calling (605) 129-2784. VAERS does not give medical advice. 6. How can I learn more?  Ask your healthcare provider. He or she can give you the vaccine package insert or suggest other sources of information.  Call your local or state health department.  Contact the Centers for Disease Control and Prevention (CDC): ? Call (647)572-1397 (1-800-CDC-INFO) or ? Visit the CDC's website at http://hunter.com/ CDC Vaccine Information Statement (VIS) Recombinant Zoster Vaccine (01/09/2017) This information is not intended to replace advice given to you by your health care provider. Make sure you discuss any questions you have with your health care provider. Document Released: 01/24/2017 Document Revised: 01/24/2017 Document Reviewed: 01/24/2017 Elsevier Interactive Patient Education  2018 [employer] Maintenance for Postmenopausal Women Menopause is a normal process in which your reproductive ability comes to an end. This process happens gradually over a span of months to years, usually between the ages of 81 and 1. Menopause is complete when you have missed 12 consecutive menstrual periods. It is important to talk with your health care provider about some of the most common conditions that affect postmenopausal women, such as heart disease, cancer, and bone loss (osteoporosis). Adopting a healthy lifestyle and getting preventive care  can help to promote your health and wellness. Those actions can also lower your chances of developing some of these common conditions. What should I know about menopause? During menopause, you may experience a number of symptoms, such as:  Moderate-to-severe hot flashes.  Night sweats.  Decrease in sex drive.  Mood swings.  Headaches.  Tiredness.  Irritability.  Memory problems.  Insomnia.  Choosing to treat or not to treat menopausal changes is an individual decision that you make with your health care  provider. What should I know about hormone replacement therapy and supplements? Hormone therapy products are effective for treating symptoms that are associated with menopause, such as hot flashes and night sweats. Hormone replacement carries certain risks, especially as you become older. If you are thinking about using estrogen or estrogen with progestin treatments, discuss the benefits and risks with your health care provider. What should I know about heart disease and stroke? Heart disease, heart attack, and stroke become more likely as you age. This may be due, in part, to the hormonal changes that your body experiences during menopause. These can affect how your body processes dietary fats, triglycerides, and cholesterol. Heart attack and stroke are both medical emergencies. There are many things that you can do to help prevent heart disease and stroke:  Have your blood pressure checked at least every 1-2 years. High blood pressure causes heart disease and increases the risk of stroke.  If you are 19-35 years old, ask your health care provider if you should take aspirin to prevent a heart attack or a stroke.  Do not use any tobacco products, including cigarettes, chewing tobacco, or electronic cigarettes. If you need help quitting, ask your health care provider.  It is important to eat a healthy diet and maintain a healthy weight. ? Be sure to include plenty of vegetables, fruits, low-fat dairy products, and lean protein. ? Avoid eating foods that are high in solid fats, added sugars, or salt (sodium).  Get regular exercise. This is one of the most important things that you can do for your health. ? Try to exercise for at least 150 minutes each week. The type of exercise that you do should increase your heart rate and make you sweat. This is known as moderate-intensity exercise. ? Try to do strengthening exercises at least twice each week. Do these in addition to the moderate-intensity  exercise.  Know your numbers.Ask your health care provider to check your cholesterol and your blood glucose. Continue to have your blood tested as directed by your health care provider.  What should I know about cancer screening? There are several types of cancer. Take the following steps to reduce your risk and to catch any cancer development as early as possible. Breast Cancer  Practice breast self-awareness. ? This means understanding how your breasts normally appear and feel. ? It also means doing regular breast self-exams. Let your health care provider know about any changes, no matter how small.  If you are 65 or older, have a clinician do a breast exam (clinical breast exam or CBE) every year. Depending on your age, family history, and medical history, it may be recommended that you also have a yearly breast X-ray (mammogram).  If you have a family history of breast cancer, talk with your health care provider about genetic screening.  If you are at high risk for breast cancer, talk with your health care provider about having an MRI and a mammogram every year.  Breast cancer (BRCA)  gene test is recommended for women who have family members with BRCA-related cancers. Results of the assessment will determine the need for genetic counseling and BRCA1 and for BRCA2 testing. BRCA-related cancers include these types: ? Breast. This occurs in males or females. ? Ovarian. ? Tubal. This may also be called fallopian tube cancer. ? Cancer of the abdominal or pelvic lining (peritoneal cancer). ? Prostate. ? Pancreatic.  Cervical, Uterine, and Ovarian Cancer Your health care provider may recommend that you be screened regularly for cancer of the pelvic organs. These include your ovaries, uterus, and vagina. This screening involves a pelvic exam, which includes checking for microscopic changes to the surface of your cervix (Pap test).  For women ages 21-65, health care providers may recommend a  pelvic exam and a Pap test every three years. For women ages 21-65, they may recommend the Pap test and pelvic exam, combined with testing for human papilloma virus (HPV), every five years. Some types of HPV increase your risk of cervical cancer. Testing for HPV may also be done on women of any age who have unclear Pap test results.  Other health care providers may not recommend any screening for nonpregnant women who are considered low risk for pelvic cancer and have no symptoms. Ask your health care provider if a screening pelvic exam is right for you.  If you have had past treatment for cervical cancer or a condition that could lead to cancer, you need Pap tests and screening for cancer for at least 20 years after your treatment. If Pap tests have been discontinued for you, your risk factors (such as having a new sexual partner) need to be reassessed to determine if you should start having screenings again. Some women have medical problems that increase the chance of getting cervical cancer. In these cases, your health care provider may recommend that you have screening and Pap tests more often.  If you have a family history of uterine cancer or ovarian cancer, talk with your health care provider about genetic screening.  If you have vaginal bleeding after reaching menopause, tell your health care provider.  There are currently no reliable tests available to screen for ovarian cancer.  Lung Cancer Lung cancer screening is recommended for adults 62-35 years old who are at high risk for lung cancer because of a history of smoking. A yearly low-dose CT scan of the lungs is recommended if you:  Currently smoke.  Have a history of at least 30 pack-years of smoking and you currently smoke or have quit within the past 15 years. A pack-year is smoking an average of one pack of cigarettes per day for one year.  Yearly screening should:  Continue until it has been 15 years since you quit.  Stop if  you develop a health problem that would prevent you from having lung cancer treatment.  Colorectal Cancer  This type of cancer can be detected and can often be prevented.  Routine colorectal cancer screening usually begins at age 79 and continues through age 34.  If you have risk factors for colon cancer, your health care provider may recommend that you be screened at an earlier age.  If you have a family history of colorectal cancer, talk with your health care provider about genetic screening.  Your health care provider may also recommend using home test kits to check for hidden blood in your stool.  A small camera at the end of a tube can be used to examine your colon  directly (sigmoidoscopy or colonoscopy). This is done to check for the earliest forms of colorectal cancer.  Direct examination of the colon should be repeated every 5-10 years until age 68. However, if early forms of precancerous polyps or small growths are found or if you have a family history or genetic risk for colorectal cancer, you may need to be screened more often.  Skin Cancer  Check your skin from head to toe regularly.  Monitor any moles. Be sure to tell your health care provider: ? About any new moles or changes in moles, especially if there is a change in a mole's shape or color. ? If you have a mole that is larger than the size of a pencil eraser.  If any of your family members has a history of skin cancer, especially at a young age, talk with your health care provider about genetic screening.  Always use sunscreen. Apply sunscreen liberally and repeatedly throughout the day.  Whenever you are outside, protect yourself by wearing long sleeves, pants, a wide-brimmed hat, and sunglasses.  What should I know about osteoporosis? Osteoporosis is a condition in which bone destruction happens more quickly than new bone creation. After menopause, you may be at an increased risk for osteoporosis. To help prevent  osteoporosis or the bone fractures that can happen because of osteoporosis, the following is recommended:  If you are 37-2 years old, get at least 1,000 mg of calcium and at least 600 mg of vitamin D per day.  If you are older than age 45 but younger than age 38, get at least 1,200 mg of calcium and at least 600 mg of vitamin D per day.  If you are older than age 27, get at least 1,200 mg of calcium and at least 800 mg of vitamin D per day.  Smoking and excessive alcohol intake increase the risk of osteoporosis. Eat foods that are rich in calcium and vitamin D, and do weight-bearing exercises several times each week as directed by your health care provider. What should I know about how menopause affects my mental health? Depression may occur at any age, but it is more common as you become older. Common symptoms of depression include:  Low or sad mood.  Changes in sleep patterns.  Changes in appetite or eating patterns.  Feeling an overall lack of motivation or enjoyment of activities that you previously enjoyed.  Frequent crying spells.  Talk with your health care provider if you think that you are experiencing depression. What should I know about immunizations? It is important that you get and maintain your immunizations. These include:  Tetanus, diphtheria, and pertussis (Tdap) booster vaccine.  Influenza every year before the flu season begins.  Pneumonia vaccine.  Shingles vaccine.  Your health care provider may also recommend other immunizations. This information is not intended to replace advice given to you by your health care provider. Make sure you discuss any questions you have with your health care provider. Document Released: 01/06/2006 Document Revised: 06/03/2016 Document Reviewed: 08/18/2015 Elsevier Interactive Patient Education  2018 [employer].

## 2018-02-20 ENCOUNTER — Ambulatory Visit
Admission: RE | Admit: 2018-02-20 | Discharge: 2018-02-20 | Disposition: A | Discharge status: Home / Self Care | Payer: 59 | Source: Ambulatory Visit | Attending: Obstetrics and Gynecology | Admitting: Obstetrics and Gynecology

## 2018-02-20 DIAGNOSIS — Z1239 Encounter for other screening for malignant neoplasm of breast: Secondary | ICD-10-CM

## 2018-02-20 DIAGNOSIS — Z1231 Encounter for screening mammogram for malignant neoplasm of breast: Secondary | ICD-10-CM | POA: Diagnosis present

## 2019-01-29 ENCOUNTER — Encounter: Payer: 59 | Admitting: Obstetrics and Gynecology

## 2019-01-29 ENCOUNTER — Ambulatory Visit (INDEPENDENT_AMBULATORY_CARE_PROVIDER_SITE_OTHER): Payer: 59 | Admitting: Obstetrics and Gynecology

## 2019-01-29 ENCOUNTER — Encounter: Payer: Self-pay | Admitting: Obstetrics and Gynecology

## 2019-01-29 VITALS — BP 132/83 | HR 70 | Ht 64.0 in | Wt 198.2 lb

## 2019-01-29 DIAGNOSIS — Z01419 Encounter for gynecological examination (general) (routine) without abnormal findings: Secondary | ICD-10-CM | POA: Diagnosis not present

## 2019-01-29 DIAGNOSIS — Z1239 Encounter for other screening for malignant neoplasm of breast: Secondary | ICD-10-CM

## 2019-01-29 DIAGNOSIS — Z79890 Hormone replacement therapy (postmenopausal): Secondary | ICD-10-CM | POA: Diagnosis not present

## 2019-01-29 MED ORDER — ESTRADIOL-NORETHINDRONE ACET 1-0.5 MG PO TABS: 1.0000 | ORAL_TABLET | Freq: Every day | ORAL | 3 refills | Status: DC

## 2019-01-29 NOTE — Progress Notes (Signed)
Patient comes in today for her annual exam. She has no concerns today. Pap is due in 2021. Mammogram ordered today. Labs done through PCP.

## 2019-01-29 NOTE — Progress Notes (Signed)
HPI:      [name] is a 63 y.o. [id number] who LMP was No LMP recorded. Patient is postmenopausal.  Subjective:   She presents today for her annual examination.  She has been taking HRT.  She and Dr. Enzo Jacobs discussed discontinuation versus continuation.  She would like to discuss this again today. She has no complaints.  She denies any vaginal bleeding. She has recently had DEXA scanning.   Hx: The following portions of the patient's history were reviewed and updated as appropriate:             She  has a past medical history of Arthritis, GERD (gastroesophageal reflux disease), and Skin cancer. She does not have any pertinent problems on file. She  has a past surgical history that includes Nasal septum surgery (1975); Cesarean section ([id number]); Foot surgery (2012); Dilation and curettage of uterus; Tubal ligation; Colonoscopy with propofol (N/A, 07/04/2017); and Breast biopsy (Left, 2000). Her family history is not on file. She  reports that she has never smoked. She has never used smokeless tobacco. She reports current alcohol use. She reports that she does not use drugs. She has a current medication list which includes the following prescription(s): estradiol, norethindrone, vitamin d (ergocalciferol), and estradiol-norethindrone. She has No Known Allergies.       Review of Systems:  Review of Systems  Constitutional: Denied constitutional symptoms, night sweats, recent illness, fatigue, fever, insomnia and weight loss.  Eyes: Denied eye symptoms, eye pain, photophobia, vision change and visual disturbance.  Ears/Nose/Throat/Neck: Denied ear, nose, throat or neck symptoms, hearing loss, nasal discharge, sinus congestion and sore throat.  Cardiovascular: Denied cardiovascular symptoms, arrhythmia, chest pain/pressure, edema, exercise intolerance, orthopnea and palpitations.  Respiratory: Denied pulmonary symptoms, asthma, pleuritic pain, productive sputum, cough, dyspnea and  wheezing.  Gastrointestinal: Denied, gastro-esophageal reflux, melena, nausea and vomiting.  Genitourinary: Denied genitourinary symptoms including symptomatic vaginal discharge, pelvic relaxation issues, and urinary complaints.  Musculoskeletal: Denied musculoskeletal symptoms, stiffness, swelling, muscle weakness and myalgia.  Dermatologic: Denied dermatology symptoms, rash and scar.  Neurologic: Denied neurology symptoms, dizziness, headache, neck pain and syncope.  Psychiatric: Denied psychiatric symptoms, anxiety and depression.  Endocrine: Denied endocrine symptoms including hot flashes and night sweats.   Meds:   Current Outpatient Medications on File Prior to Visit  Medication Sig Dispense Refill  . estradiol (ESTRACE) 1 MG tablet Take 1 tablet (1 mg total) by mouth daily. 90 tablet 3  . norethindrone (AYGESTIN) 5 MG tablet Take 1 tablet (5 mg total) by mouth daily. 90 tablet 3  . Vitamin D, Ergocalciferol, (DRISDOL) 50000 UNITS CAPS capsule TAKE 1 CAPSULE BY MOUTH WEEKLY  1   No current facility-administered medications on file prior to visit.     Objective:     Vitals:   01/29/19 0811  BP: 132/83  Pulse: 70              Physical examination General NAD, Conversant  HEENT Atraumatic; Op clear with mmm.  Normo-cephalic. Pupils reactive. Anicteric sclerae  Thyroid/Neck Smooth without nodularity or enlargement. Normal ROM.  Neck Supple.  Skin No rashes, lesions or ulceration. Normal palpated skin turgor. No nodularity.  Breasts: No masses or discharge.  Symmetric.  No axillary adenopathy.  Lungs: Clear to auscultation.No rales or wheezes. Normal Respiratory effort, no retractions.  Heart: NSR.  No murmurs or rubs appreciated. No periferal edema  Abdomen: Soft.  Non-tender.  No masses.  No HSM. No hernia  Extremities: Moves all appropriately.  Normal  ROM for age. No lymphadenopathy.  Neuro: Oriented to PPT.  Normal mood. Normal affect.     Pelvic:   Vulva: Normal  appearance.  No lesions.  Vagina: No lesions or abnormalities noted.  Support: Normal pelvic support.  Urethra No masses tenderness or scarring.  Meatus Normal size without lesions or prolapse.  Cervix: Normal appearance.  No lesions.  Anus: Normal exam.  No lesions.  Perineum: Normal exam.  No lesions.        Bimanual   Uterus: Normal size.  Non-tender.  Mobile.  AV.  Adnexae: No masses.  Non-tender to palpation.  Cul-de-sac: Negative for abnormality.      Assessment:    [id number] Patient Active Problem List   Diagnosis Date Noted  . Special screening for malignant neoplasms, colon   . Increased BMI 01/12/2016  . Menopause 01/12/2016  . Urinary urgency 08/10/2015  . Nocturia 08/10/2015     1. Well woman exam   2. Screening for breast cancer   3. Postmenopausal hormone therapy        Plan:            1.  We have discussed HRT in detail.  Risks and benefits were again reviewed.  Patient has elected to continue HRT.  Would like to try a pill with both estrogen and progesterone and instead of taking 2 pills.  We will begin [place].  2.  Basic Screening Recommendations The basic screening recommendations for asymptomatic women were discussed with the patient during her visit.  The age-appropriate recommendations were discussed with her and the rational for the tests reviewed.  When I am informed by the patient that another primary care physician has previously obtained the age-appropriate tests and they are up-to-date, only outstanding tests are ordered and referrals given as necessary.  Abnormal results of tests will be discussed with her when all of her results are completed. Mammogram ordered -recommend Pap next year Orders Orders Placed This Encounter  Procedures  . MM 3D SCREEN BREAST BILATERAL     Meds ordered this encounter  Medications  . estradiol-norethindrone (ACTIVELLA) 1-0.5 MG tablet    Sig: Take 1 tablet by mouth daily.    Dispense:  90 tablet     Refill:  3        F/U  Return in about 1 year (around 01/29/2020) for Annual Physical.  Finis Bud, M.D. 01/29/2019 9:14 AM

## 2019-02-04 ENCOUNTER — Ambulatory Visit: Payer: 59 | Admitting: Podiatry

## 2019-03-27 ENCOUNTER — Encounter: Payer: Self-pay | Admitting: Podiatry

## 2019-03-27 ENCOUNTER — Ambulatory Visit: Payer: 59 | Admitting: Podiatry

## 2019-03-27 ENCOUNTER — Ambulatory Visit (INDEPENDENT_AMBULATORY_CARE_PROVIDER_SITE_OTHER): Payer: 59

## 2019-03-27 ENCOUNTER — Other Ambulatory Visit: Payer: Self-pay | Admitting: Podiatry

## 2019-03-27 ENCOUNTER — Other Ambulatory Visit: Payer: Self-pay

## 2019-03-27 VITALS — Temp 98.4°F

## 2019-03-27 DIAGNOSIS — M7752 Other enthesopathy of left foot: Secondary | ICD-10-CM | POA: Diagnosis not present

## 2019-03-27 DIAGNOSIS — M779 Enthesopathy, unspecified: Secondary | ICD-10-CM | POA: Diagnosis not present

## 2019-03-27 DIAGNOSIS — M79672 Pain in left foot: Secondary | ICD-10-CM

## 2019-03-27 MED ORDER — TRIAMCINOLONE ACETONIDE 10 MG/ML IJ SUSP
10.0000 mg | Freq: Once | INTRAMUSCULAR | Status: AC
  Administered 2019-03-27: 10 mg

## 2019-03-28 NOTE — Progress Notes (Signed)
Subjective:   Patient ID: Pamela Jacobs, female   DOB: 63 y.o.   MRN: [id number]   HPI Patient states she is developing a lot of pain on top of the left foot and states that it is been hurting her for around 6 weeks and was better for over a year   ROS      Objective:  Physical Exam  Neurovascular status intact with midtarsal joint arthritis and extensor tendinitis left with good healing from previous surgical procedure right     Assessment:  Extensor tendinitis left with inflammation     Plan:  H&P x-ray reviewed and today sterile prep and then injected the dorsal extensor tendon complex 3 mg Kenalog 5 mg Xylocaine advised on continued reduced activity and wider shoes and reappoint as needed  X-rays indicate relatively extensive midfoot arthritis left

## 2019-04-02 ENCOUNTER — Telehealth: Payer: Self-pay | Admitting: *Deleted

## 2019-04-02 NOTE — Telephone Encounter (Signed)
Left message informing pt it was not unusual to have a flare of symptoms and to take an antiinflammatory OTC that she tolerates take as directed, ice 3-4 times a day for 15-20 minutes/sessions and protect skin from the ice with a light cloth, and to call if problem continues.

## 2019-04-02 NOTE — Telephone Encounter (Signed)
Pt states she received an injection 03/27/2019 by Dr. Paulla Dolly and has not received any relief but had received relief with previous injection in 2018.

## 2019-05-16 ENCOUNTER — Other Ambulatory Visit: Payer: Self-pay

## 2019-05-16 ENCOUNTER — Ambulatory Visit
Admission: RE | Admit: 2019-05-16 | Discharge: 2019-05-16 | Disposition: A | Discharge status: Home / Self Care | Payer: 59 | Source: Ambulatory Visit | Attending: Obstetrics and Gynecology | Admitting: Obstetrics and Gynecology

## 2019-05-16 DIAGNOSIS — Z01419 Encounter for gynecological examination (general) (routine) without abnormal findings: Secondary | ICD-10-CM

## 2019-05-16 DIAGNOSIS — Z1231 Encounter for screening mammogram for malignant neoplasm of breast: Secondary | ICD-10-CM | POA: Insufficient documentation

## 2019-05-16 DIAGNOSIS — Z1239 Encounter for other screening for malignant neoplasm of breast: Secondary | ICD-10-CM

## 2019-06-26 ENCOUNTER — Encounter: Payer: Self-pay | Admitting: Podiatry

## 2019-06-26 ENCOUNTER — Other Ambulatory Visit: Payer: Self-pay

## 2019-06-26 ENCOUNTER — Ambulatory Visit (INDEPENDENT_AMBULATORY_CARE_PROVIDER_SITE_OTHER): Payer: 59 | Admitting: Podiatry

## 2019-06-26 VITALS — Temp 98.0°F

## 2019-06-26 DIAGNOSIS — S96912A Strain of unspecified muscle and tendon at ankle and foot level, left foot, initial encounter: Secondary | ICD-10-CM | POA: Diagnosis not present

## 2019-06-26 MED ORDER — METHYLPREDNISOLONE 4 MG PO TBPK: ORAL_TABLET | ORAL | 0 refills | Status: DC

## 2019-06-26 NOTE — Progress Notes (Signed)
She presents today for follow-up of pain to the dorsal aspect of her left ankle.  She states that back in April it was really painful and it never completely improved.  I was waiting and exercising and trying to get it to get better but it never really improved.  States that just recently the other day it felt something pop it was so bad that I could actually hear it pop and there is exquisite pain in the anterior ankle exactly the same place that had been hurting previously where shot was placed and is now still tender.  Denies any new trauma fever chills nausea or vomiting.  ROS: Denies fever chills nausea vomiting muscle aches pains calf pain back pain chest pain shortness of breath.  Objective: Vital signs are stable alert and oriented x3.  Pulses are palpable.  Neurologic sensorium is intact.  Is intact per Thornell Mule monofilament.  Deep tendon reflexes are intact muscle strength is normal symmetrical.  All of her tendons including her posterior tibial tendon plantar flexors dorsiflexors inverters everters all of her tendons are intact and appear to have full strength.  She does have a palpable area of pain to the anterior lateral aspect of her left ankle and there is a small palpable mass here as well as pain across the dorsum of the foot.  Radiographs taken today do not demonstrate any type of osseous abnormalities of the sella osteoarthritic changes of the dorsal of the Lisfranc's joints.  Some soft tissue swelling of the anterior ankle.  Assessment: At this point I think she has anterior extensor tendon tear possibly anterior talofibular ligament tear as well.  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  Plan: At this point I put her on a Medrol Dosepak and I am requesting an MRI of the left ankle.

## 2019-06-27 ENCOUNTER — Telehealth: Payer: Self-pay

## 2019-06-27 DIAGNOSIS — S96912A Strain of unspecified muscle and tendon at ankle and foot level, left foot, initial encounter: Secondary | ICD-10-CM

## 2019-06-27 NOTE — Telephone Encounter (Signed)
Per [facility] website, no precert is required at this time Called patient and informed her via voice mail to call scheduling dept and set up MRI appt to her convenience

## 2019-06-27 NOTE — Telephone Encounter (Signed)
-----   Message from Rip Harbour, [facility] sent at 06/26/2019  2:40 PM EDT ----- Regarding: MRI MRI left ankle - evaluate extensor tendon tear/osteoarthritis left - surgical consideration

## 2019-07-03 ENCOUNTER — Ambulatory Visit
Admission: RE | Admit: 2019-07-03 | Discharge: 2019-07-03 | Disposition: A | Discharge status: Home / Self Care | Payer: 59 | Source: Ambulatory Visit | Attending: Podiatry | Admitting: Podiatry

## 2019-07-03 ENCOUNTER — Other Ambulatory Visit: Payer: Self-pay

## 2019-07-03 DIAGNOSIS — S96912A Strain of unspecified muscle and tendon at ankle and foot level, left foot, initial encounter: Secondary | ICD-10-CM | POA: Diagnosis not present

## 2019-07-09 ENCOUNTER — Telehealth: Payer: Self-pay

## 2019-07-09 NOTE — Telephone Encounter (Signed)
MRI disc request has been faxed to [facility]

## 2019-07-09 NOTE — Telephone Encounter (Signed)
-----   Message from Garrel Ridgel, [place] sent at 07/09/2019  7:16 AM EDT ----- Send for over read and inform the patient of the delay.

## 2019-07-09 NOTE — Telephone Encounter (Signed)
Patient was notified of delay and results per Dr. Milinda Pointer

## 2019-07-09 NOTE — Telephone Encounter (Signed)
-----   Message from Garrel Ridgel, [place] sent at 07/09/2019  7:00 AM EDT ----- Tell her that now it looks like severe osteo arthritis but we will send it for a second opinion. ----- Message ----- From: Mack Hook, LPN Sent: 05/15/4858   1:49 PM EDT To: Garrel Ridgel, DPM  Patient is calling regarding her MRI results.  Please let me know if you want to send for overread or does she need to come in soon?  Thanks!

## 2019-07-15 ENCOUNTER — Telehealth: Payer: Self-pay

## 2019-07-15 NOTE — Telephone Encounter (Signed)
-----   Message from Garrel Ridgel, [place] sent at 07/09/2019  7:16 AM EDT ----- Send for over read and inform the patient of the delay.

## 2019-07-15 NOTE — Telephone Encounter (Signed)
Disc has been mailed to [facility] 

## 2019-07-18 ENCOUNTER — Telehealth: Payer: Self-pay | Admitting: *Deleted

## 2019-07-18 NOTE — Telephone Encounter (Signed)
Eduard Clos - [facility] states the disc they received for this pt runs as if there are no images on the disc.

## 2019-07-22 NOTE — Telephone Encounter (Signed)
Called [facility], another MRI disc has been requested to be faxed directly to [place].

## 2019-08-13 ENCOUNTER — Encounter: Payer: Self-pay | Admitting: Podiatry

## 2019-08-13 ENCOUNTER — Ambulatory Visit: Payer: 59 | Admitting: Podiatry

## 2019-08-13 ENCOUNTER — Other Ambulatory Visit: Payer: Self-pay

## 2019-08-13 DIAGNOSIS — M19072 Primary osteoarthritis, left ankle and foot: Secondary | ICD-10-CM | POA: Diagnosis not present

## 2019-08-13 MED ORDER — METHYLPREDNISOLONE 4 MG PO TBPK: ORAL_TABLET | ORAL | 0 refills | Status: DC

## 2019-08-13 NOTE — Progress Notes (Signed)
She presents today for her MRI results stating that there is been no change since last visit.   ROS: Denies fever chills nausea vomiting muscle aches pains calf pain back pain chest pain shortness of breath.   Objective: Vital signs are stable she is alert and oriented x3.  Pulses are palpable.  Neurologic sensorium is intact.  Deep tendon reflexes are intact muscle strength is normal symmetrical.  Orthopedic evaluation still demonstrates some bogginess the distal aspect of the tibialis anterior tendon at its insertion on the dorsal medial foot she has exquisite pain on palpation overlying the TMT joints and the navicular cuneiform joints.  No open lesions or wounds are noted.  MRI and over read does state that there is significant osteoarthritic changes of the midfoot.  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  Secondarily ganglion cyst distal aspect of the tibialis anterior and peroneal tendinopathy  Plan: Discussed etiology pathology and surgical therapies at this point time start her on Medrol Dosepak to be followed by injection today 20 mg of Kenalog 5 mg Marcaine dorsal dorsum of the foot near the joints.  I would like to go ahead and reassess her and discuss possible need for surgical intervention.

## 2019-08-15 ENCOUNTER — Encounter: Payer: Self-pay | Admitting: Podiatry

## 2019-09-11 ENCOUNTER — Encounter: Payer: Self-pay | Admitting: Podiatry

## 2019-09-11 ENCOUNTER — Other Ambulatory Visit: Payer: Self-pay

## 2019-09-11 ENCOUNTER — Ambulatory Visit: Payer: 59 | Admitting: Podiatry

## 2019-09-11 DIAGNOSIS — M19072 Primary osteoarthritis, left ankle and foot: Secondary | ICD-10-CM | POA: Diagnosis not present

## 2019-09-11 NOTE — Progress Notes (Signed)
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  Objective: Vital signs are stable alert and oriented x3.  Pulses are palpable.  Less pain on palpation dorsal aspect of the foot much decrease in edema no erythema cellulitis drainage odor no pain.  Assessment: Well-healing

## 2019-12-05 ENCOUNTER — Ambulatory Visit: Payer: 59 | Admitting: Podiatry

## 2019-12-05 ENCOUNTER — Other Ambulatory Visit: Payer: Self-pay

## 2019-12-05 DIAGNOSIS — M722 Plantar fascial fibromatosis: Secondary | ICD-10-CM

## 2019-12-05 NOTE — Progress Notes (Signed)
She presents today states that her feet are swelling whose and she is going to see her PCP this week for that however she is complaining of her left heel hurting her.  Objective: Vital signs are stable she alert and oriented x3 she has pain to the left heel.  She has pain on direct palpation medial calcaneal tubercle pulses are palpable.  No open lesions or wounds.  Minimal pitting edema ankles bilateral.  Assessment: Pain in limb secondary to plantar fasciitis left mild edema bilateral.  Plan: I injected the left heel today 20 mg Kenalog 5 mg Marcaine point of maximal tenderness.  Tolerated procedure well without complications.  Also placed her in a plantar fascial brace.

## 2020-01-01 ENCOUNTER — Ambulatory Visit: Payer: 59 | Admitting: Obstetrics and Gynecology

## 2020-01-01 ENCOUNTER — Encounter: Payer: Self-pay | Admitting: Obstetrics and Gynecology

## 2020-01-01 ENCOUNTER — Other Ambulatory Visit: Payer: Self-pay

## 2020-01-01 VITALS — BP 136/79 | HR 65 | Ht 63.0 in | Wt 193.0 lb

## 2020-01-01 DIAGNOSIS — B373 Candidiasis of vulva and vagina: Secondary | ICD-10-CM | POA: Diagnosis not present

## 2020-01-01 DIAGNOSIS — B3731 Acute candidiasis of vulva and vagina: Secondary | ICD-10-CM

## 2020-01-01 LAB — POCT URINALYSIS DIPSTICK OB
Bilirubin, UA: NEGATIVE
Blood, UA: NEGATIVE
Glucose, UA: NEGATIVE
Ketones, UA: NEGATIVE
Leukocytes, UA: NEGATIVE
Nitrite, UA: NEGATIVE
POC,PROTEIN,UA: NEGATIVE
Spec Grav, UA: 1.01 (ref 1.010–1.025)
Urobilinogen, UA: 0.2 E.U./dL
pH, UA: 7 (ref 5.0–8.0)

## 2020-01-01 MED ORDER — FLUCONAZOLE 150 MG PO TABS: 150.0000 mg | ORAL_TABLET | ORAL | 0 refills | Status: DC

## 2020-01-01 NOTE — Progress Notes (Signed)
HPI:      Pamela Jacobs is a 64 y.o. [id number] who LMP was No LMP recorded. Patient is postmenopausal.  Subjective:   She presents today with complaint of her "inner vagina not feeling right."  She feels as if "something is tearing inside".  This has been present for approximately 2 weeks.  It is intermittent and she feels like it may be worse when sitting down. She reports no difficulty with bowel or bladder. She denies vaginal discharge or new sexual partner. She says "nothing is falling out". She continues to use HRT as directed.    Hx: The following portions of the patient's history were reviewed and updated as appropriate:             She  has a past medical history of Arthritis, GERD (gastroesophageal reflux disease), and Skin cancer. She does not have any pertinent problems on file. She  has a past surgical history that includes Nasal septum surgery (1975); Cesarean section [id number]); Foot surgery (2012); Dilation and curettage of uterus; Tubal ligation; Colonoscopy with propofol (N/A, 07/04/2017); and Breast excisional biopsy (Left, 2000). Her family history is not on file. She  reports that she has never smoked. She has never used smokeless tobacco. She reports current alcohol use. She reports that she does not use drugs. She has a current medication list which includes the following prescription(s): hydrochlorothiazide, vitamin d (ergocalciferol), clobetasol ointment, cyclobenzaprine, diclofenac, estradiol-norethindrone, fluconazole, and omeprazole. She has No Known Allergies.       Review of Systems:  Review of Systems  Constitutional: Denied constitutional symptoms, night sweats, recent illness, fatigue, fever, insomnia and weight loss.  Eyes: Denied eye symptoms, eye pain, photophobia, vision change and visual disturbance.  Ears/Nose/Throat/Neck: Denied ear, nose, throat or neck symptoms, hearing loss, nasal discharge, sinus congestion and sore throat.  Cardiovascular:  Denied cardiovascular symptoms, arrhythmia, chest pain/pressure, edema, exercise intolerance, orthopnea and palpitations.  Respiratory: Denied pulmonary symptoms, asthma, pleuritic pain, productive sputum, cough, dyspnea and wheezing.  Gastrointestinal: Denied, gastro-esophageal reflux, melena, nausea and vomiting.  Genitourinary: See HPI for additional information.  Musculoskeletal: Denied musculoskeletal symptoms, stiffness, swelling, muscle weakness and myalgia.  Dermatologic: Denied dermatology symptoms, rash and scar.  Neurologic: Denied neurology symptoms, dizziness, headache, neck pain and syncope.  Psychiatric: Denied psychiatric symptoms, anxiety and depression.  Endocrine: Denied endocrine symptoms including hot flashes and night sweats.   Meds:   Current Outpatient Medications on File Prior to Visit  Medication Sig Dispense Refill  . hydrochlorothiazide (HYDRODIURIL) 12.5 MG tablet Take 12.5 mg by mouth every morning.    . Vitamin D, Ergocalciferol, (DRISDOL) 50000 UNITS CAPS capsule TAKE 1 CAPSULE BY MOUTH WEEKLY  1  . clobetasol ointment (TEMOVATE) 0.05 % APP EXT AA BID UNTIL AREA CLEAR    . cyclobenzaprine (FLEXERIL) 5 MG tablet TK 1 T PO Q 8 H    . diclofenac (VOLTAREN) 75 MG EC tablet TK 1 T PO QAM    . estradiol-norethindrone (ACTIVELLA) 1-0.5 MG tablet Take 1 tablet by mouth daily. 90 tablet 3  . omeprazole (PRILOSEC) 40 MG capsule TK 1 C PO D     No current facility-administered medications on file prior to visit.    Objective:     Vitals:   01/01/20 0943  BP: 136/79  Pulse: 65              Physical examination   Pelvic:   Vulva: Normal appearance.  No lesions.  Moderate erythema  Vagina: No lesions  or abnormalities noted.  Support: Normal pelvic support.  Urethra No masses tenderness or scarring.  Meatus Normal size without lesions or prolapse.  Cervix: Normal appearance.  No lesions.  Anus: Normal exam.  No lesions.  Perineum: Normal exam.  No lesions.         Bimanual   Uterus: Normal size.  Non-tender.  Mobile.  AV.  Adnexae: No masses.  Non-tender to palpation.  Cul-de-sac: Negative for abnormality.   WET PREP: clue cells: absent, KOH (yeast): positive, odor: absent and trichomoniasis: negative Ph:  < 4.5   Assessment:    [id number] Patient Active Problem List   Diagnosis Date Noted  . Special screening for malignant neoplasms, colon   . Increased BMI 01/12/2016  . Menopause 01/12/2016  . Urinary urgency 08/10/2015  . Nocturia 08/10/2015     1. Monilial vulvovaginitis     Based on her wet prep and her series of symptoms this is the most likely diagnosis.   Plan:            1.  We will treat with Diflucan  2.  Urine sent for C&S Orders Orders Placed This Encounter  Procedures  . POC Urinalysis Dipstick OB     Meds ordered this encounter  Medications  . fluconazole (DIFLUCAN) 150 MG tablet    Sig: Take 1 tablet (150 mg total) by mouth every 3 (three) days. For three doses    Dispense:  3 tablet    Refill:  0      F/U  Return for We will contact her with any abnormal test results, Pt to contact us if symptoms worsen. I spent 22 minutes involved in the care of this patient preparing to see the patient by obtaining and reviewing her medical history (including labs, imaging tests and prior procedures), documenting clinical information in the electronic health record (EHR), counseling and coordinating care plans, writing and sending prescriptions, ordering tests or procedures and directly communicating with the patient by discussing pertinent items from her history and physical exam as well as detailing my assessment and plan as noted above so that she has an informed understanding.  All of her questions were answered. Finis Bud, M.D. 01/01/2020 12:47 PM

## 2020-01-30 ENCOUNTER — Encounter: Payer: 59 | Admitting: Obstetrics and Gynecology

## 2020-03-04 ENCOUNTER — Other Ambulatory Visit (HOSPITAL_COMMUNITY)
Admission: RE | Admit: 2020-03-04 | Discharge: 2020-03-04 | Disposition: A | Discharge status: Home / Self Care | Payer: 59 | Source: Ambulatory Visit | Attending: Obstetrics and Gynecology | Admitting: Obstetrics and Gynecology

## 2020-03-04 ENCOUNTER — Other Ambulatory Visit: Payer: Self-pay

## 2020-03-04 ENCOUNTER — Ambulatory Visit (INDEPENDENT_AMBULATORY_CARE_PROVIDER_SITE_OTHER): Payer: 59 | Admitting: Obstetrics and Gynecology

## 2020-03-04 ENCOUNTER — Encounter: Payer: Self-pay | Admitting: Obstetrics and Gynecology

## 2020-03-04 VITALS — BP 123/80 | HR 73 | Ht 64.0 in | Wt 197.0 lb

## 2020-03-04 DIAGNOSIS — Z01419 Encounter for gynecological examination (general) (routine) without abnormal findings: Secondary | ICD-10-CM

## 2020-03-04 DIAGNOSIS — Z79890 Hormone replacement therapy (postmenopausal): Secondary | ICD-10-CM

## 2020-03-04 DIAGNOSIS — Z124 Encounter for screening for malignant neoplasm of cervix: Secondary | ICD-10-CM

## 2020-03-04 DIAGNOSIS — Z1231 Encounter for screening mammogram for malignant neoplasm of breast: Secondary | ICD-10-CM | POA: Diagnosis not present

## 2020-03-04 MED ORDER — ESTRADIOL-NORETHINDRONE ACET 1-0.5 MG PO TABS: 1.0000 | ORAL_TABLET | Freq: Every day | ORAL | 3 refills | Status: DC

## 2020-03-04 NOTE — Progress Notes (Signed)
HPI:      [name] is a 64 y.o. [id number] who LMP was No LMP recorded. Patient is postmenopausal.  Subjective:   She presents today for her annual examination.  She has no complaints.  Yeast infection completely resolved.  Taking [place] and would like to continue.  Patient had Covid in February but recovered well.  Has been vaccinated.    Hx: The following portions of the patient's history were reviewed and updated as appropriate:             She  has a past medical history of Arthritis, GERD (gastroesophageal reflux disease), and Skin cancer. She does not have any pertinent problems on file. She  has a past surgical history that includes Nasal septum surgery (1975); Cesarean section [id number]); Foot surgery (2012); Dilation and curettage of uterus; Tubal ligation; Colonoscopy with propofol (N/A, 07/04/2017); and Breast excisional biopsy (Left, 2000). Her family history is not on file. She  reports that she has never smoked. She has never used smokeless tobacco. She reports current alcohol use. She reports that she does not use drugs. She has a current medication list which includes the following prescription(s): hydrochlorothiazide, omeprazole, vitamin d (ergocalciferol), clobetasol ointment, and estradiol-norethindrone. She has No Known Allergies.       Review of Systems:  Review of Systems  Constitutional: Denied constitutional symptoms, night sweats, recent illness, fatigue, fever, insomnia and weight loss.  Eyes: Denied eye symptoms, eye pain, photophobia, vision change and visual disturbance.  Ears/Nose/Throat/Neck: Denied ear, nose, throat or neck symptoms, hearing loss, nasal discharge, sinus congestion and sore throat.  Cardiovascular: Denied cardiovascular symptoms, arrhythmia, chest pain/pressure, edema, exercise intolerance, orthopnea and palpitations.  Respiratory: Denied pulmonary symptoms, asthma, pleuritic pain, productive sputum, cough, dyspnea and wheezing.   Gastrointestinal: Denied, gastro-esophageal reflux, melena, nausea and vomiting.  Genitourinary: Denied genitourinary symptoms including symptomatic vaginal discharge, pelvic relaxation issues, and urinary complaints.  Musculoskeletal: Denied musculoskeletal symptoms, stiffness, swelling, muscle weakness and myalgia.  Dermatologic: Denied dermatology symptoms, rash and scar.  Neurologic: Denied neurology symptoms, dizziness, headache, neck pain and syncope.  Psychiatric: Denied psychiatric symptoms, anxiety and depression.  Endocrine: Denied endocrine symptoms including hot flashes and night sweats.   Meds:   Current Outpatient Medications on File Prior to Visit  Medication Sig Dispense Refill  . hydrochlorothiazide (HYDRODIURIL) 12.5 MG tablet Take 12.5 mg by mouth every morning.    Marland Kitchen omeprazole (PRILOSEC) 40 MG capsule TK 1 C PO D    . Vitamin D, Ergocalciferol, (DRISDOL) 50000 UNITS CAPS capsule TAKE 1 CAPSULE BY MOUTH WEEKLY  1  . clobetasol ointment (TEMOVATE) 0.05 % APP EXT AA BID UNTIL AREA CLEAR     No current facility-administered medications on file prior to visit.    Objective:     Vitals:   03/04/20 0734  BP: 123/80  Pulse: 73              Physical examination General NAD, Conversant  HEENT Atraumatic; Op clear with mmm.  Normo-cephalic. Pupils reactive. Anicteric sclerae  Thyroid/Neck Smooth without nodularity or enlargement. Normal ROM.  Neck Supple.  Skin No rashes, lesions or ulceration. Normal palpated skin turgor. No nodularity.  Breasts: No masses or discharge.  Symmetric.  No axillary adenopathy.  Lungs: Clear to auscultation.No rales or wheezes. Normal Respiratory effort, no retractions.  Heart: NSR.  No murmurs or rubs appreciated. No periferal edema  Abdomen: Soft.  Non-tender.  No masses.  No HSM. No hernia  Extremities: Moves all appropriately.  Normal ROM for age. No lymphadenopathy.  Neuro: Oriented to PPT.  Normal mood. Normal affect.     Pelvic:    Vulva: Normal appearance.  No lesions.  Vagina: No lesions or abnormalities noted.  Support: Normal pelvic support.  Urethra No masses tenderness or scarring.  Meatus Normal size without lesions or prolapse.  Cervix: Normal appearance.  No lesions.  Moderate stenosis  Anus: Normal exam.  No lesions.  Perineum: Normal exam.  No lesions.        Bimanual   Uterus: Normal size.  Non-tender.  Mobile.  AV.  Adnexae: No masses.  Non-tender to palpation.  Cul-de-sac: Negative for abnormality.      Assessment:    [id number] Patient Active Problem List   Diagnosis Date Noted  . Special screening for malignant neoplasms, colon   . Increased BMI 01/12/2016  . Menopause 01/12/2016  . Urinary urgency 08/10/2015  . Nocturia 08/10/2015     1. Well woman exam with routine gynecological exam   2. Postmenopausal hormone therapy   3. Encounter for screening mammogram for malignant neoplasm of breast        Plan:            1.  Basic Screening Recommendations The basic screening recommendations for asymptomatic women were discussed with the patient during her visit.  The age-appropriate recommendations were discussed with her and the rational for the tests reviewed.  When I am informed by the patient that another primary care physician has previously obtained the age-appropriate tests and they are up-to-date, only outstanding tests are ordered and referrals given as necessary.  Abnormal results of tests will be discussed with her when all of her results are completed.  Routine preventative health maintenance measures emphasized: Exercise/Diet/Weight control, Tobacco Warnings, Alcohol/Substance use risks and Stress Management Pap performed -mammogram ordered -patient gets lab work through primary care physician 2.  Continue Activella Orders Orders Placed This Encounter  Procedures  . MM 3D SCREEN BREAST BILATERAL     Meds ordered this encounter  Medications  . estradiol-norethindrone  (ACTIVELLA) 1-0.5 MG tablet    Sig: Take 1 tablet by mouth daily.    Dispense:  90 tablet    Refill:  3        F/U  Return in about 1 year (around 03/04/2021) for Annual Physical.  Finis Bud, M.D. 03/04/2020 8:06 AM

## 2020-03-04 NOTE — Addendum Note (Signed)
Addended by: Durwin Glaze on: 03/04/2020 08:50 AM   Modules accepted: Orders

## 2020-03-06 LAB — CYTOLOGY - PAP
Adequacy: ABSENT
Comment: NEGATIVE
Diagnosis: NEGATIVE
High risk HPV: NEGATIVE

## 2020-03-23 ENCOUNTER — Telehealth: Payer: Self-pay | Admitting: Obstetrics and Gynecology

## 2020-03-23 NOTE — Telephone Encounter (Signed)
LM for patient that Pap was normal. That was the only results I have for patient.

## 2020-03-23 NOTE — Telephone Encounter (Signed)
Pt called in and stated that she wants to go over her lab results. The pt stated she needs a afternoon call. Please advise

## 2020-04-01 ENCOUNTER — Other Ambulatory Visit: Payer: Self-pay

## 2020-04-01 ENCOUNTER — Encounter: Payer: Self-pay | Admitting: Podiatry

## 2020-04-01 ENCOUNTER — Ambulatory Visit: Payer: 59 | Admitting: Podiatry

## 2020-04-01 DIAGNOSIS — M778 Other enthesopathies, not elsewhere classified: Secondary | ICD-10-CM

## 2020-04-01 DIAGNOSIS — M7752 Other enthesopathy of left foot: Secondary | ICD-10-CM | POA: Diagnosis not present

## 2020-04-01 NOTE — Progress Notes (Signed)
She presents today states that she was doing good for a while as she refers to the left foot.  She has pain across the dorsum of the left foot and the anterior ankle.  She is also concerned about a reactive hyperkeratotic lesion medial aspect second toe right foot.  States that the plantar fasciitis is doing better.  Objective: Vital signs are stable alert and oriented x3.  Pulses are palpable.  Reactive hyperkeratotic lesion second toe right foot no open lesions or wounds.  This is at the medial aspect of the toe at the base of the toe where it rubs the hallux.  I debrided it.  She also has pain on palpation of the dorsal aspect of the foot as well as the anterior ankle.  Assessment: Ankle joint capsulitis and Lisfranc's capsulitis.  Plan: I injected the ankle joint 20 mg Kenalog after sterile Betadine skin prep.  Injected 10 mg across the dorsum of the foot at the point of maximal tenderness after sterile Betadine skin prep.  Tolerated procedure well.  Debrided reactive hyperkeratotic tissue follow-up with her as needed.

## 2020-05-19 ENCOUNTER — Ambulatory Visit
Admission: RE | Admit: 2020-05-19 | Discharge: 2020-05-19 | Disposition: A | Discharge status: Home / Self Care | Payer: 59 | Source: Ambulatory Visit | Attending: Obstetrics and Gynecology | Admitting: Obstetrics and Gynecology

## 2020-05-19 DIAGNOSIS — Z01419 Encounter for gynecological examination (general) (routine) without abnormal findings: Secondary | ICD-10-CM

## 2020-05-19 DIAGNOSIS — Z1231 Encounter for screening mammogram for malignant neoplasm of breast: Secondary | ICD-10-CM | POA: Diagnosis present

## 2020-10-21 ENCOUNTER — Other Ambulatory Visit: Payer: Self-pay

## 2020-10-21 ENCOUNTER — Ambulatory Visit: Payer: 59 | Admitting: Podiatry

## 2020-10-21 ENCOUNTER — Encounter: Payer: Self-pay | Admitting: Podiatry

## 2020-10-21 DIAGNOSIS — L600 Ingrowing nail: Secondary | ICD-10-CM | POA: Diagnosis not present

## 2020-10-21 DIAGNOSIS — M7752 Other enthesopathy of left foot: Secondary | ICD-10-CM

## 2020-10-21 DIAGNOSIS — M25872 Other specified joint disorders, left ankle and foot: Secondary | ICD-10-CM

## 2020-10-21 MED ORDER — NEOMYCIN-POLYMYXIN-HC 3.5-10000-1 OT SUSP: OTIC | 0 refills | Status: DC

## 2020-10-21 NOTE — Patient Instructions (Signed)

## 2020-10-21 NOTE — Progress Notes (Signed)
  Subjective:  Patient ID: Pamela Jacobs, female    DOB: 11/10/1956,  MRN: [id number]  Chief Complaint  Patient presents with  . Nail Problem    ingrown toenail left hallux medial border and request another in jection left foot    64 y.o. female presents with the above complaint. History confirmed with patient. Previously had has injection in the left ankle joint which was helpful by Dr. Milinda Pointer. Just an ingrown nail on the left hallux medial border, she clipped a corner out and is been alleviating a little bit of the pressure but this is been a recurrent issue for her.  Objective:  Physical Exam: warm, good capillary refill, no trophic changes or ulcerative lesions, normal DP and PT pulses and normal sensory exam. Left Foot: Ingrowing nail hallux border medially with out paronychia. She has mild pain on palpation of the anterior ankle joint with range of motion and dorsiflexion. No signs of fracture or instability Assessment:  No diagnosis found.   Plan:  Patient was evaluated and treated and all questions answered.  Injections of the left ankle have been helpful for her. Following sterile prep with Betadine and alcohol 0.5 cc each of 2% Xylocaine plain, 0.5% Marcaine plain, 2 mg dexamethasone phosphate and 5 mg of Kenalog was injected the left ankle. She tolerated procedure well and was dressed with a Band-Aid.    Ingrown Nail, left -Patient elects to proceed with minor surgery to remove ingrown toenail today. Consent reviewed and signed by patient. -Ingrown nail excised. See procedure note. -Educated on post-procedure care including soaking. Written instructions provided and reviewed. -Patient to follow up in 2 weeks for nail check.  Procedure: Excision of Ingrown Toenail Location: Left 1st toe medial nail borders. Anesthesia: Lidocaine 1% plain; 1.5 mL and Marcaine 0.5% plain; 1.5 mL, digital block. Skin Prep: Betadine. Dressing: Silvadene; telfa; dry, sterile, compression  dressing. Technique: Following skin prep, the toe was exsanguinated and a tourniquet was secured at the base of the toe. The affected nail border was freed, split with a nail splitter, and excised. Chemical matrixectomy was then performed with phenol and irrigated out with alcohol. The tourniquet was then removed and sterile dressing applied. Disposition: Patient tolerated procedure well. Patient to return in 2 weeks for follow-up.     Return in about 2 weeks (around 11/04/2020) for nail re-check.

## 2020-11-04 ENCOUNTER — Ambulatory Visit (INDEPENDENT_AMBULATORY_CARE_PROVIDER_SITE_OTHER): Payer: 59 | Admitting: Podiatry

## 2020-11-04 ENCOUNTER — Other Ambulatory Visit: Payer: Self-pay

## 2020-11-04 DIAGNOSIS — M899 Disorder of bone, unspecified: Secondary | ICD-10-CM

## 2020-11-04 DIAGNOSIS — L600 Ingrowing nail: Secondary | ICD-10-CM

## 2020-11-04 DIAGNOSIS — M949 Disorder of cartilage, unspecified: Secondary | ICD-10-CM

## 2020-11-04 DIAGNOSIS — M19072 Primary osteoarthritis, left ankle and foot: Secondary | ICD-10-CM

## 2020-11-06 ENCOUNTER — Encounter: Payer: Self-pay | Admitting: Podiatry

## 2020-11-06 ENCOUNTER — Telehealth: Payer: Self-pay

## 2020-11-06 NOTE — Telephone Encounter (Signed)
2 orders need to be placed for CT to get approved. CT left foot w/o contrast and CT left ankle w/o contrast.

## 2020-11-06 NOTE — Telephone Encounter (Signed)
I'll order it but do you know if this is a GI thing, or an insurance thing? Because they had me do this on another patient Sallye Lat I think) and it got denied for ordering 2 studies and I had to do a peer to peer. I'm afraid it's creating even more work when I really just need to see her ankle joint

## 2020-11-06 NOTE — Progress Notes (Signed)
  Subjective:  Patient ID: Pamela Jacobs, female    DOB: 1956-05-02,  MRN: [id number]  No chief complaint on file.   64 y.o. female returns for follow-up of left ankle pain and ingrown nail removal left hallux.  The nail is doing well and does not hurt.  Has had some small drainage.  Ankle still painful and has not had much relief after the last injection  Objective:  Physical Exam: warm, good capillary refill, no trophic changes or ulcerative lesions, normal DP and PT pulses and normal sensory exam. Left Foot: Matricectomy site is healing well. She has mild pain on palpation of the anterior ankle joint with range of motion and dorsiflexion. No signs of fracture or instability Assessment:   1. Osteochondral lesion of talar dome   2. Osteoarthritis of left ankle and foot      Plan:  Patient was evaluated and treated and all questions answered.  Can leave hallux nail open to air and discontinue soaks and ointment at this point    For the left ankle and midfoot she still has significant pain which is not improved.  Her previous MRI and radiographs showed significant midtarsal arthritis as well as concern for an osteochondral lesion of the talar dome.  I would like to evaluate the ankle joint with a CT scan to see if there is any cystic changes within the talar dome underneath this lesion.  This will help guide surgical correction, she would like to consider this for later this spring.  Her pain predominately seems to be centered about the ankle joint itself which I think is probably secondary to osteochondral lesion.  She has less pain in the midfoot which would require multiple fusions like she had on the other foot.  I will see her back in 1 to 2 months following CT scan to discuss surgical intervention.    No follow-ups on file.

## 2020-11-09 NOTE — Telephone Encounter (Signed)
GI said its an insurance thing. Im not sure if they know for sure.

## 2020-12-02 ENCOUNTER — Other Ambulatory Visit: Payer: 59

## 2020-12-16 ENCOUNTER — Ambulatory Visit
Admission: RE | Admit: 2020-12-16 | Discharge: 2020-12-16 | Disposition: A | Discharge status: Home / Self Care | Payer: 59 | Source: Ambulatory Visit | Attending: Podiatry | Admitting: Podiatry

## 2020-12-16 DIAGNOSIS — M19072 Primary osteoarthritis, left ankle and foot: Secondary | ICD-10-CM

## 2020-12-25 ENCOUNTER — Telehealth: Payer: Self-pay

## 2020-12-25 NOTE — Telephone Encounter (Signed)
Patient called requesting the results of her CT scan   Please advise

## 2020-12-30 ENCOUNTER — Encounter: Payer: Self-pay | Admitting: Podiatry

## 2020-12-30 ENCOUNTER — Other Ambulatory Visit: Payer: Self-pay

## 2020-12-30 ENCOUNTER — Ambulatory Visit (INDEPENDENT_AMBULATORY_CARE_PROVIDER_SITE_OTHER): Payer: 59 | Admitting: Podiatry

## 2020-12-30 DIAGNOSIS — M949 Disorder of cartilage, unspecified: Secondary | ICD-10-CM

## 2020-12-30 DIAGNOSIS — M19072 Primary osteoarthritis, left ankle and foot: Secondary | ICD-10-CM | POA: Diagnosis not present

## 2020-12-30 DIAGNOSIS — M899 Disorder of bone, unspecified: Secondary | ICD-10-CM

## 2020-12-30 DIAGNOSIS — M25872 Other specified joint disorders, left ankle and foot: Secondary | ICD-10-CM | POA: Diagnosis not present

## 2021-01-01 ENCOUNTER — Encounter: Payer: Self-pay | Admitting: Podiatry

## 2021-01-01 NOTE — Progress Notes (Signed)
Subjective:  Patient ID: Pamela Jacobs, female    DOB: 1956/06/27,  MRN: [id number]  Chief Complaint  Patient presents with  . Foot Pain    "its about the same"  Discuss CT results    65 y.o. female returns for follow-up of left ankle and foot pain.  She a CT scan and is here for review Objective:  Physical Exam: warm, good capillary refill, no trophic changes or ulcerative lesions, normal DP and PT pulses and normal sensory exam. Left Foot: She has mild pain on palpation of the anterior ankle joint with range of motion and dorsiflexion most notably in the medial gutter.  She has diffuse pain over the midtarsal joint and midfoot. No signs of fracture or instability  Study Result  Narrative & Impression  CLINICAL DATA:  Chronic foot pain.  EXAM: CT OF THE LEFT FOOT WITHOUT CONTRAST  TECHNIQUE: Multidetector CT imaging of the left foot was performed according to the standard protocol. Multiplanar CT image reconstructions were also generated.  COMPARISON:  MRI 07/03/2019  FINDINGS: The tibiotalar joint is fairly well maintained. No significant degenerative changes or joint effusion. There is a small healed appearing osteochondral lesion involving the medial talar dome.  The subtalar joints are maintained. Mild degenerative changes. No tarsal coalition.  As demonstrated on the prior MRI there is fairly severe midfoot degenerative changes with joint space narrowing and osteophytic spurring. There are also areas of subchondral cystic type change. Could not exclude small erosions. Partial auto fusion changes are noted at the tarsal metatarsal joints most notably involving the middle and lateral cuneiform articulations with the second and third metatarsals. Findings could be posttraumatic or possibly due to an inflammatory arthropathy. I do not see any calcified vessels to suggest this is diabetic neuropathy but neuropathic disease would certainly be a  consideration.  The sinus tarsi is unremarkable. Grossly by CT the major ligaments and tendons appear intact. A small calcaneal heel spur is noted.  Grossly normal ankle and foot musculature.  IMPRESSION: 1. Fairly severe midfoot degenerative changes with joint space narrowing and osteophytic spurring. Could not exclude small erosions. 2. Partial auto fusion changes at the tarsal metatarsal joints most notably involving the middle and lateral cuneiform articulations with the second and third metatarsals. Findings could be posttraumatic, inflammatory arthropathy or less likely neuropathic disease. 3. Small healed appearing osteochondral lesion involving the medial talar dome.   Electronically Signed   By: Marijo Sanes M.D.   On: 12/17/2020 08:34   Study Result  Narrative & Impression  CLINICAL DATA:  Left foot and ankle pain for 1-2 years. Pain is centered over the dorsum of the foot and has markedly worsened over the past 3 weeks. Question tendon tear.  EXAM: MRI OF THE LEFT ANKLE WITHOUT CONTRAST  TECHNIQUE: Multiplanar, multisequence MR imaging of the ankle was performed. No intravenous contrast was administered.  COMPARISON:  Plain films right foot 03/27/2019.  FINDINGS: TENDONS  Peroneal: Intact.  Posteromedial: Intact.  Anterior: Intact. There is some fluid about the extensor digitorum brevis tendons at the level of the tibiotalar joint compatible with tendinosis.  Achilles: Intact.  Plantar Fascia: Very small partial thickness tear of the plantar fascia is seen centrally. The plantar fascia otherwise appears normal.  LIGAMENTS  Lateral: Intact.  Medial: Intact.  CARTILAGE  Ankle Joint: Osteochondral lesion of the medial talar dome measures 0.5 cm transverse by 0.9 cm AP. Overlying cartilage is mildly thinned. Small cyst is seen subjacent to the lesion.  Subtalar  Joints/Sinus Tarsi: Normal.  Bones: The patient has  advanced midfoot osteoarthritis with subchondral edema and small cyst worst at the articulation of the navicular and lateral cuneiform and about the second tarsometatarsal joint.  Other: None.  IMPRESSION: Dominant finding is midfoot osteoarthritis which appears severe at the articulation of the navicular and lateral cuneiform and second tarsometatarsal joint.  Very small partial thickness tear of the central fibers the plantar fascia.  Osteochondral lesion of the medial talar dome. Small underlying subchondral cyst is identified.  Fluid about the extensor digitorum brevis tendons compatible with tenosynovitis. Negative for tendon tear.   Electronically Signed   By: Inge Rise M.D.   On: 07/03/2019 15:59     Assessment:   1. Osteochondral lesion of talar dome   2. Osteoarthritis of left ankle and foot   3. Impingement syndrome of left ankle      Plan:  Patient was evaluated and treated and all questions answered.   Reviewed the CT scan in detail.  We discussed that she does have an osteochondral lesion, this appears improved and seems to have healed since her last MRI in August 2020.  The cystic area has filled in.  I do expect that she has some cartilage loss in this area.  We discussed treatment for this including cartilage augmentation techniques, arthroscopy as well as eventual treatment for end-stage ankle arthritis including replacement and/or fusion.  We also discussed the end-stage arthritis of her naviculocuneiform and tarsometatarsal joints.  The ankle currently bothers her worse.  We discussed the postoperative course was for both procedures.  For the midfoot she would require significant pan cuneiform arthrodesis.  She has an upcoming trip to [place] this summer.  She would like to have the ankle addressed before then.  I think arthroscopic evaluation and debridement with possible microfracture if there is still a lesion could offer her some  relief.  We will plan for surgery after she discusses with her husband.  Likely will proceed with midfoot fusion in the future when she is ready for this in fall or winter.   Return in about 1 month (around 01/27/2021).

## 2021-01-27 ENCOUNTER — Ambulatory Visit: Payer: 59 | Admitting: Podiatry

## 2021-03-10 ENCOUNTER — Other Ambulatory Visit: Payer: Self-pay

## 2021-03-10 ENCOUNTER — Encounter: Payer: Self-pay | Admitting: Obstetrics and Gynecology

## 2021-03-10 ENCOUNTER — Ambulatory Visit (INDEPENDENT_AMBULATORY_CARE_PROVIDER_SITE_OTHER): Payer: 59 | Admitting: Obstetrics and Gynecology

## 2021-03-10 VITALS — BP 148/89 | HR 76 | Ht 64.0 in | Wt 205.8 lb

## 2021-03-10 DIAGNOSIS — Z79890 Hormone replacement therapy (postmenopausal): Secondary | ICD-10-CM | POA: Diagnosis not present

## 2021-03-10 DIAGNOSIS — Z01419 Encounter for gynecological examination (general) (routine) without abnormal findings: Secondary | ICD-10-CM

## 2021-03-10 DIAGNOSIS — Z1231 Encounter for screening mammogram for malignant neoplasm of breast: Secondary | ICD-10-CM

## 2021-03-10 MED ORDER — ESTRADIOL-NORETHINDRONE ACET 1-0.5 MG PO TABS: 1.0000 | ORAL_TABLET | Freq: Every day | ORAL | 3 refills | Status: DC

## 2021-03-10 NOTE — Progress Notes (Signed)
HPI:      [name] is a 65 y.o. [id number] who LMP was No LMP recorded. Patient is postmenopausal.  Subjective:   She presents today for her annual examination.  She continues to use Activella and she has no issues with it.  She would like to continue.  She gets her lab work through her PCP. Other than some weight gain she has no complaints.  She has tried a low-carb diet but now plans on a different diet because that did not work.    Hx: The following portions of the patient's history were reviewed and updated as appropriate:             She  has a past medical history of Arthritis, GERD (gastroesophageal reflux disease), and Skin cancer. She does not have any pertinent problems on file. She  has a past surgical history that includes Nasal septum surgery (1975); Cesarean section ([id number]); Foot surgery (2012); Dilation and curettage of uterus; Tubal ligation; Colonoscopy with propofol (N/A, 07/04/2017); and Breast excisional biopsy (Left, 2000). Her family history is not on file. She  reports that she has never smoked. She has never used smokeless tobacco. She reports current alcohol use. She reports that she does not use drugs. She has a current medication list which includes the following prescription(s): hydrochlorothiazide, omeprazole, vitamin d (ergocalciferol), clobetasol ointment, estradiol-norethindrone, methocarbamol, and neomycin-polymyxin-hydrocortisone. She has No Known Allergies.       Review of Systems:  Review of Systems  Constitutional: Denied constitutional symptoms, night sweats, recent illness, fatigue, fever, insomnia and weight loss.  Eyes: Denied eye symptoms, eye pain, photophobia, vision change and visual disturbance.  Ears/Nose/Throat/Neck: Denied ear, nose, throat or neck symptoms, hearing loss, nasal discharge, sinus congestion and sore throat.  Cardiovascular: Denied cardiovascular symptoms, arrhythmia, chest pain/pressure, edema, exercise intolerance,  orthopnea and palpitations.  Respiratory: Denied pulmonary symptoms, asthma, pleuritic pain, productive sputum, cough, dyspnea and wheezing.  Gastrointestinal: Denied, gastro-esophageal reflux, melena, nausea and vomiting.  Genitourinary: Denied genitourinary symptoms including symptomatic vaginal discharge, pelvic relaxation issues, and urinary complaints.  Musculoskeletal: Denied musculoskeletal symptoms, stiffness, swelling, muscle weakness and myalgia.  Dermatologic: Denied dermatology symptoms, rash and scar.  Neurologic: Denied neurology symptoms, dizziness, headache, neck pain and syncope.  Psychiatric: Denied psychiatric symptoms, anxiety and depression.  Endocrine: Denied endocrine symptoms including hot flashes and night sweats.   Meds:   Current Outpatient Medications on File Prior to Visit  Medication Sig Dispense Refill  . hydrochlorothiazide (HYDRODIURIL) 12.5 MG tablet Take 12.5 mg by mouth every morning.    Marland Kitchen omeprazole (PRILOSEC) 40 MG capsule TK 1 C PO D    . Vitamin D, Ergocalciferol, (DRISDOL) 50000 UNITS CAPS capsule TAKE 1 CAPSULE BY MOUTH WEEKLY  1  . clobetasol ointment (TEMOVATE) 0.05 % APP EXT AA BID UNTIL AREA CLEAR (Patient not taking: Reported on 03/10/2021)    . methocarbamol (ROBAXIN) 500 MG tablet  (Patient not taking: Reported on 03/10/2021)    . neomycin-polymyxin-hydrocortisone (CORTISPORIN) 3.5-10000-1 OTIC suspension Apply 1-2 drops daily after soaking and cover with bandaid (Patient not taking: Reported on 03/10/2021) 10 mL 0   No current facility-administered medications on file prior to visit.          Objective:     Vitals:   03/10/21 0729  BP: (!) 148/89  Pulse: 76    Filed Weights   03/10/21 0729  Weight: 205 lb 12.8 oz (93.4 kg)  Physical examination General NAD, Conversant  HEENT Atraumatic; Op clear with mmm.  Normo-cephalic. Pupils reactive. Anicteric sclerae  Thyroid/Neck Smooth without nodularity or enlargement.  Normal ROM.  Neck Supple.  Skin No rashes, lesions or ulceration. Normal palpated skin turgor. No nodularity.  Breasts: No masses or discharge.  Symmetric.  No axillary adenopathy.  Lungs: Clear to auscultation.No rales or wheezes. Normal Respiratory effort, no retractions.  Heart: NSR.  No murmurs or rubs appreciated. No periferal edema  Abdomen: Soft.  Non-tender.  No masses.  No HSM. No hernia  Extremities: Moves all appropriately.  Normal ROM for age. No lymphadenopathy.  Neuro: Oriented to PPT.  Normal mood. Normal affect.     Pelvic:   Vulva: Normal appearance.  No lesions.  Vagina: No lesions or abnormalities noted.  Support: Normal pelvic support.  Urethra No masses tenderness or scarring.  Meatus Normal size without lesions or prolapse.  Cervix: Normal appearance.  No lesions.  Anus: Normal exam.  No lesions.  Perineum: Normal exam.  No lesions.        Bimanual   Uterus: Normal size.  Non-tender.  Mobile.  AV.  Adnexae: No masses.  Non-tender to palpation.  Cul-de-sac: Negative for abnormality.      Assessment:    [id number] Patient Active Problem List   Diagnosis Date Noted  . Special screening for malignant neoplasms, colon   . Increased BMI 01/12/2016  . Menopause 01/12/2016  . Urinary urgency 08/10/2015  . Nocturia 08/10/2015     1. Well woman exam with routine gynecological exam   2. Postmenopausal hormone therapy   3. Encounter for screening mammogram for malignant neoplasm of breast     Normal exam.   Plan:            1.  Basic Screening Recommendations The basic screening recommendations for asymptomatic women were discussed with the patient during her visit.  The age-appropriate recommendations were discussed with her and the rational for the tests reviewed.  When I am informed by the patient that another primary care physician has previously obtained the age-appropriate tests and they are up-to-date, only outstanding tests are ordered and referrals  given as necessary.  Abnormal results of tests will be discussed with her when all of her results are completed.  Routine preventative health maintenance measures emphasized: Exercise/Diet/Weight control, Tobacco Warnings, Alcohol/Substance use risks and Stress Management Mammogram ordered 2.  Caloric restriction discussed regarding weight loss. 3.  600 mg of calcium daily recommended. 4.  Continue Activella  Orders Orders Placed This Encounter  Procedures  . MM 3D SCREEN BREAST BILATERAL     Meds ordered this encounter  Medications  . estradiol-norethindrone (ACTIVELLA) 1-0.5 MG tablet    Sig: Take 1 tablet by mouth daily.    Dispense:  90 tablet    Refill:  3          F/U  Return in about 1 year (around 03/10/2022) for Annual Physical.  Finis Bud, M.D. 03/10/2021 8:06 AM

## 2021-05-12 ENCOUNTER — Other Ambulatory Visit: Payer: Self-pay | Admitting: Obstetrics and Gynecology

## 2021-05-12 DIAGNOSIS — Z79890 Hormone replacement therapy (postmenopausal): Secondary | ICD-10-CM

## 2021-05-20 ENCOUNTER — Ambulatory Visit
Admission: RE | Admit: 2021-05-20 | Discharge: 2021-05-20 | Disposition: A | Discharge status: Home / Self Care | Payer: 59 | Source: Ambulatory Visit | Attending: Obstetrics and Gynecology | Admitting: Obstetrics and Gynecology

## 2021-05-20 ENCOUNTER — Other Ambulatory Visit: Payer: Self-pay

## 2021-05-20 DIAGNOSIS — Z1231 Encounter for screening mammogram for malignant neoplasm of breast: Secondary | ICD-10-CM | POA: Diagnosis present

## 2021-06-09 ENCOUNTER — Other Ambulatory Visit: Payer: Self-pay

## 2021-06-09 ENCOUNTER — Encounter: Payer: Self-pay | Admitting: Podiatry

## 2021-06-09 ENCOUNTER — Ambulatory Visit: Payer: 59 | Admitting: Podiatry

## 2021-06-09 DIAGNOSIS — L600 Ingrowing nail: Secondary | ICD-10-CM

## 2021-06-09 MED ORDER — CEPHALEXIN 500 MG PO CAPS: 500.0000 mg | ORAL_CAPSULE | Freq: Three times a day (TID) | ORAL | 0 refills | Status: DC

## 2021-06-09 MED ORDER — CEPHALEXIN 500 MG PO CAPS: 500.0000 mg | ORAL_CAPSULE | Freq: Three times a day (TID) | ORAL | 0 refills | Status: AC

## 2021-06-09 NOTE — Progress Notes (Signed)
  Subjective:  Patient ID: Pamela Jacobs, female    DOB: 02-09-1956,  MRN: [id number]  Chief Complaint  Patient presents with   Foot Pain     left foot great toe pain    65 y.o. female returns for follow-up with the above complaint. History confirmed with patient.  The ankle is doing well she is going to [place] to see her family tomorrow.  She noticed pain swelling and redness around the inside edge of the great toe which we have previously worked on  Objective:  Physical Exam: warm, good capillary refill, no trophic changes or ulcerative lesions, normal DP and PT pulses, and normal sensory exam. Left Foot: Paronychia of medial nail border Assessment:   1. Ingrowing left great toenail      Plan:  Patient was evaluated and treated and all questions answered.  Evaluated the patient and inspected the nail, after Betadine prep and digital block with 1.5 cc each of 2% lidocaine and 0.5% Marcaine I debrided the medial nail fold and was able to find a retained spicule of nail which I excised.  A small amount of purulence was expressed.  This was irrigated thoroughly with hydroperoxide and dressed with povidone ointment and a bandage.  5 days Keflex sent to pharmacy.  Should resolve uneventfully.  I discussed with her if this happens again would likely need to repeat matricectomy.  Return if symptoms worsen or fail to improve.

## 2021-11-03 ENCOUNTER — Encounter: Payer: Self-pay | Admitting: Podiatry

## 2021-11-03 ENCOUNTER — Other Ambulatory Visit: Payer: Self-pay

## 2021-11-03 ENCOUNTER — Ambulatory Visit: Payer: 59 | Admitting: Podiatry

## 2021-11-03 ENCOUNTER — Ambulatory Visit (INDEPENDENT_AMBULATORY_CARE_PROVIDER_SITE_OTHER): Payer: 59

## 2021-11-03 DIAGNOSIS — S90112A Contusion of left great toe without damage to nail, initial encounter: Secondary | ICD-10-CM | POA: Diagnosis not present

## 2021-11-03 DIAGNOSIS — S90212A Contusion of left great toe with damage to nail, initial encounter: Secondary | ICD-10-CM | POA: Diagnosis not present

## 2021-11-03 NOTE — Patient Instructions (Signed)

## 2021-11-04 NOTE — Progress Notes (Signed)
  Subjective:  Patient ID: Pamela Jacobs, female    DOB: [birth year],  MRN: [id number]  Chief Complaint  Patient presents with   Toe Injury    Hallux left - stumped toe on rocking chair 10/20/21, red and sore around the nail, nail is loosened    65 y.o. female presents with the above complaint. History confirmed with patient.  This is the same toenail that we have worked on before it was doing well until this happened  Objective:  Physical Exam: warm, good capillary refill, no trophic changes or ulcerative lesions, normal DP and PT pulses, and normal sensory exam. Left Foot: Loose nail with erythema and bruising proximally hallux Assessment:   1. Contusion of left great toe with damage to nail, initial encounter      Plan:  Patient was evaluated and treated and all questions answered.  Recommended avulsion of the loose nail today.  Discussed we will do this in a temporary manner and allow it to grow back.  Following sterile prep with Betadine and digital block with 1.5 cc each of 2% lidocaine plain and 0.5% Marcaine plain to the left hallux nail plate was avulsed using a freer elevator.  She will return as needed for this, soaking instructions given.  Return if symptoms worsen or fail to improve.

## 2022-03-11 ENCOUNTER — Ambulatory Visit: Payer: 59 | Admitting: Podiatry

## 2022-03-11 ENCOUNTER — Ambulatory Visit: Payer: 59

## 2022-03-11 ENCOUNTER — Encounter: Payer: Self-pay | Admitting: Podiatry

## 2022-03-11 DIAGNOSIS — M7751 Other enthesopathy of right foot: Secondary | ICD-10-CM

## 2022-03-11 DIAGNOSIS — M7752 Other enthesopathy of left foot: Secondary | ICD-10-CM

## 2022-03-11 DIAGNOSIS — M778 Other enthesopathies, not elsewhere classified: Secondary | ICD-10-CM

## 2022-03-11 MED ORDER — TRIAMCINOLONE ACETONIDE 10 MG/ML IJ SUSP
10.0000 mg | Freq: Once | INTRAMUSCULAR | Status: AC
  Administered 2022-03-11: 10 mg

## 2022-03-11 NOTE — Progress Notes (Signed)
SITUATION ?Reason for Consult: Evaluation for Bilateral Custom Foot Orthoses ?Patient / Caregiver Report: Patient is ready for foot orthotics ? ?OBJECTIVE DATA: ?Patient History / Diagnosis:  ?  ICD-10-CM   ?1. Capsulitis of foot, left  M77.8   ?  ? ? ?Current or Previous Devices:   Current user ? ?Foot Examination: ?Skin presentation:   Intact ?Ulcers & Callousing:   None ?Toe / Foot Deformities:  None ?Weight Bearing Presentation:  Rectus ?Sensation:    Intact ? ?Shoe Size:    8.14M ? ?ORTHOTIC RECOMMENDATION ?Recommended Device: 1x pair of custom functional foot orthotics ? ?GOALS OF ORTHOSES ?- Reduce Pain ?- Prevent Foot Deformity ?- Prevent Progression of Further Foot Deformity ?- Relieve Pressure ?- Improve the Overall Biomechanical Function of the Foot and Lower Extremity. ? ?ACTIONS PERFORMED ?Potential out of pocket cost was communicated to patient. Patient understood and consent to casting. Patient was casted for Foot Orthoses via crush box. Procedure was explained and patient tolerated procedure well. Casts were shipped to central fabrication. All questions were answered and concerns addressed. ? ?PLAN ?Patient is to be called for fitting when devices are ready.  ? ? ?

## 2022-03-13 NOTE — Progress Notes (Signed)
Subjective:  ? ?Patient ID: Pamela Jacobs, female   DOB: 66 y.o.   MRN: [id number]  ? ?HPI ?Patient states developing a lot of pain on the ankle and outside of the left foot stating its been very tender when pressed.  States the top has been reasonably good currently ? ? ?ROS ? ? ?   ?Objective:  ?Physical Exam  ?Inflammatory condition of the sinus tarsi left with fluid buildup within the sinus tarsi and into the lateral ankle ? ?   ?Assessment:  ?Sinus tarsitis left with inflammation pain ? ?   ?Plan:  ?Reviewed condition and x-ray went ahead today did sterile prep injected the sinus tarsi left 3 mg Kenalog 5 mg Xylocaine and advised on anti-inflammatories as needed ? ?X-rays indicate that there is no sharp advancement of arthritis there is arthritis in the midfoot that is been present with history of surgery on the right 1 ?   ? ? ?

## 2022-03-15 ENCOUNTER — Encounter: Payer: Self-pay | Admitting: Obstetrics and Gynecology

## 2022-03-15 ENCOUNTER — Ambulatory Visit (INDEPENDENT_AMBULATORY_CARE_PROVIDER_SITE_OTHER): Payer: 59 | Admitting: Obstetrics and Gynecology

## 2022-03-15 VITALS — BP 167/89 | HR 65 | Ht 64.0 in | Wt 199.8 lb

## 2022-03-15 DIAGNOSIS — Z1231 Encounter for screening mammogram for malignant neoplasm of breast: Secondary | ICD-10-CM

## 2022-03-15 DIAGNOSIS — Z79890 Hormone replacement therapy (postmenopausal): Secondary | ICD-10-CM | POA: Diagnosis not present

## 2022-03-15 DIAGNOSIS — Z01419 Encounter for gynecological examination (general) (routine) without abnormal findings: Secondary | ICD-10-CM | POA: Diagnosis not present

## 2022-03-15 DIAGNOSIS — I10 Essential (primary) hypertension: Secondary | ICD-10-CM

## 2022-03-15 MED ORDER — ESTRADIOL-NORETHINDRONE ACET 1-0.5 MG PO TABS: 1.0000 | ORAL_TABLET | Freq: Every day | ORAL | 3 refills | Status: DC

## 2022-03-15 NOTE — Progress Notes (Signed)
Patients presents for annual exam today. Patient states she has been dong good over the last year, she states her weight is her only issue. She tried phentermine but it was causing dental problems so it was stopped. Patient is due for mammogram, ordered. Patient is up to date on pap smear. Patient states no other questions or concerns at this time.  ? ?

## 2022-03-15 NOTE — Progress Notes (Signed)
HPI: ?     [name] is a 66 y.o. [id number] who LMP was No LMP recorded. Patient is postmenopausal. ? ?Subjective:  ? ?She presents today for her annual examination.  She continues to use the Activella.  She would like to stay on it. ?She does state that she continues to struggle with her weight and has tried phentermine but is not currently taking it. ? ?  Hx: ?The following portions of the patient's history were reviewed and updated as appropriate: ?            She  has a past medical history of Arthritis, GERD (gastroesophageal reflux disease), and Skin cancer. ?She does not have any pertinent problems on file. ?She  has a past surgical history that includes Nasal septum surgery (1975); Cesarean section (5573,2202); Foot surgery (2012); Dilation and curettage of uterus; Tubal ligation; Colonoscopy with propofol (N/A, 07/04/2017); and Breast excisional biopsy (Left, 2000). ?Her family history includes Hypertension in her father. ?She  reports that she has never smoked. She has never used smokeless tobacco. She reports current alcohol use. She reports that she does not use drugs. ?She has a current medication list which includes the following prescription(s): estradiol-norethindrone, omeprazole, and vitamin d (ergocalciferol). ?She has No Known Allergies. ?      ?Review of Systems:  ?Review of Systems ? ?Constitutional: Denied constitutional symptoms, night sweats, recent illness, fatigue, fever, insomnia and weight loss.  ?Eyes: Denied eye symptoms, eye pain, photophobia, vision change and visual disturbance.  ?Ears/Nose/Throat/Neck: Denied ear, nose, throat or neck symptoms, hearing loss, nasal discharge, sinus congestion and sore throat.  ?Cardiovascular: Denied cardiovascular symptoms, arrhythmia, chest pain/pressure, edema, exercise intolerance, orthopnea and palpitations.  ?Respiratory: Denied pulmonary symptoms, asthma, pleuritic pain, productive sputum, cough, dyspnea and wheezing.  ?Gastrointestinal:  Denied, gastro-esophageal reflux, melena, nausea and vomiting.  ?Genitourinary: Denied genitourinary symptoms including symptomatic vaginal discharge, pelvic relaxation issues, and urinary complaints.  ?Musculoskeletal: Denied musculoskeletal symptoms, stiffness, swelling, muscle weakness and myalgia.  ?Dermatologic: Denied dermatology symptoms, rash and scar.  ?Neurologic: Denied neurology symptoms, dizziness, headache, neck pain and syncope.  ?Psychiatric: Denied psychiatric symptoms, anxiety and depression.  ?Endocrine: Denied endocrine symptoms including hot flashes and night sweats.  ? ?Meds: ?  ?Current Outpatient Medications on File Prior to Visit  ?Medication Sig Dispense Refill  ? estradiol-norethindrone (ACTIVELLA) 1-0.5 MG tablet TAKE 1 TABLET BY MOUTH DAILY 84 tablet 3  ? omeprazole (PRILOSEC) 40 MG capsule TK 1 C PO D    ? Vitamin D, Ergocalciferol, (DRISDOL) 50000 UNITS CAPS capsule TAKE 1 CAPSULE BY MOUTH WEEKLY  1  ? ?No current facility-administered medications on file prior to visit.  ? ? ? ?Objective:  ?  ? ?Vitals:  ? 03/15/22 0921  ?BP: (!) 167/89  ?Pulse: 65  ?  ?Filed Weights  ? 03/15/22 0921  ?Weight: 199 lb 12.8 oz (90.6 kg)  ? ?  ?         Physical examination ?General NAD, Conversant  ?HEENT Atraumatic; Op clear with mmm.  Normo-cephalic. Pupils reactive. Anicteric sclerae  ?Thyroid/Neck Smooth without nodularity or enlargement. Normal ROM.  Neck Supple.  ?Skin No rashes, lesions or ulceration. Normal palpated skin turgor. No nodularity.  ?Breasts: No masses or discharge.  Symmetric.  No axillary adenopathy.  ?Lungs: Clear to auscultation.No rales or wheezes. Normal Respiratory effort, no retractions.  ?Heart: NSR.  No murmurs or rubs appreciated. No periferal edema  ?Abdomen: Soft.  Non-tender.  No masses.  No HSM. No  hernia  ?Extremities: Moves all appropriately.  Normal ROM for age. No lymphadenopathy.  ?Neuro: Oriented to PPT.  Normal mood. Normal affect.  ? ?  Pelvic:   ?Vulva: Normal  appearance.  No lesions.  ?Vagina: No lesions or abnormalities noted.  ?Support: Normal pelvic support.  ?Urethra No masses tenderness or scarring.  ?Meatus Normal size without lesions or prolapse.  ?Cervix: Normal appearance.  No lesions.  ?Anus: Normal exam.  No lesions.  ?Perineum: Normal exam.  No lesions.  ?      Bimanual   ?Uterus: Normal size.  Non-tender.  Mobile.  AV.  ?Adnexae: No masses.  Non-tender to palpation.  ?Cul-de-sac: Negative for abnormality.  ? ? ? ?Assessment:  ?  ?[id number] ?Patient Active Problem List  ? Diagnosis Date Noted  ? Special screening for malignant neoplasms, colon   ? Increased BMI 01/12/2016  ? Menopause 01/12/2016  ? Urinary urgency 08/10/2015  ? Nocturia 08/10/2015  ? ?  ?1. Well woman exam with routine gynecological exam   ?2. Screening mammogram for breast cancer   ?3. Postmenopausal hormone therapy   ?4. Hypertension, unspecified type   ? ? Patient has significant hypertension today with multiple retakes.  She also had some hypertension at her last visit. ? ? ?Plan:  ?  ?       ? 1.  Basic Screening Recommendations ?The basic screening recommendations for asymptomatic women were discussed with the patient during her visit.  The age-appropriate recommendations were discussed with her and the rational for the tests reviewed.  When I am informed by the patient that another primary care physician has previously obtained the age-appropriate tests and they are up-to-date, only outstanding tests are ordered and referrals given as necessary.  Abnormal results of tests will be discussed with her when all of her results are completed.  Routine preventative health maintenance measures emphasized: Exercise/Diet/Weight control, Tobacco Warnings, Alcohol/Substance use risks and Stress Management ?Mammogram ordered ?2.  Continue Activella ?3.  Discussed hypertension with the patient.  Recommend follow-up at her family doctor's in 2 to 3 weeks to begin antihypertensive  therapy. ? ?Orders ?Orders Placed This Encounter  ?Procedures  ? MM DIGITAL SCREENING BILATERAL  ? ? No orders of the defined types were placed in this encounter. ?    ?  ?  F/U ? Return in about 1 year (around 03/16/2023) for Annual Physical. ? ?Finis Bud, M.D. ?03/15/2022 ?9:46 AM ? ? ? ?

## 2022-03-28 ENCOUNTER — Ambulatory Visit: Payer: 59 | Admitting: Podiatry

## 2022-03-28 DIAGNOSIS — M949 Disorder of cartilage, unspecified: Secondary | ICD-10-CM | POA: Diagnosis not present

## 2022-03-28 DIAGNOSIS — M899 Disorder of bone, unspecified: Secondary | ICD-10-CM

## 2022-03-28 DIAGNOSIS — M25872 Other specified joint disorders, left ankle and foot: Secondary | ICD-10-CM | POA: Diagnosis not present

## 2022-03-28 DIAGNOSIS — M67472 Ganglion, left ankle and foot: Secondary | ICD-10-CM | POA: Diagnosis not present

## 2022-03-28 NOTE — Progress Notes (Signed)
?Subjective:  ?Patient ID: Pamela Jacobs, female    DOB: 11-15-1956,  MRN: [id number] ? ?Chief Complaint  ?Patient presents with  ? Consult  ?  Surgical consult  ? ? ?66 y.o. female returns for follow-up of left ankle and foot pain.  She was doing okay until recently when she started having worsening ankle pain, she saw Dr. Paulla Dolly in our [facility] office and had an ankle injection, this did not help at all. ? ? ?Objective:  ?Physical Exam: ?warm, good capillary refill, no trophic changes or ulcerative lesions, normal DP and PT pulses and normal sensory exam. ?Left Foot: Today minimal pain in the midtarsal joint, there is significant pain in the anterior lateral ankle, there is a small fluctuant area that is consistent with a mass, dorsiflexion and pressure here causes impinging pain. ? ?Study Result ? ?Narrative & Impression  ?CLINICAL DATA:  Chronic foot pain. ?  ?EXAM: ?CT OF THE LEFT FOOT WITHOUT CONTRAST ?  ?TECHNIQUE: ?Multidetector CT imaging of the left foot was performed according to ?the standard protocol. Multiplanar CT image reconstructions were ?also generated. ?  ?COMPARISON:  MRI 07/03/2019 ?  ?FINDINGS: ?The tibiotalar joint is fairly well maintained. No significant ?degenerative changes or joint effusion. There is a small healed ?appearing osteochondral lesion involving the medial talar dome. ?  ?The subtalar joints are maintained. Mild degenerative changes. No ?tarsal coalition. ?  ?As demonstrated on the prior MRI there is fairly severe midfoot ?degenerative changes with joint space narrowing and osteophytic ?spurring. There are also areas of subchondral cystic type change. ?Could not exclude small erosions. Partial auto fusion changes are ?noted at the tarsal metatarsal joints most notably involving the ?middle and lateral cuneiform articulations with the second and third ?metatarsals. Findings could be posttraumatic or possibly due to an ?inflammatory arthropathy. I do not see any calcified  vessels to ?suggest this is diabetic neuropathy but neuropathic disease would ?certainly be a consideration. ?  ?The sinus tarsi is unremarkable. Grossly by CT the major ligaments ?and tendons appear intact. A small calcaneal heel spur is noted. ?  ?Grossly normal ankle and foot musculature. ?  ?IMPRESSION: ?1. Fairly severe midfoot degenerative changes with joint space ?narrowing and osteophytic spurring. Could not exclude small ?erosions. ?2. Partial auto fusion changes at the tarsal metatarsal joints most ?notably involving the middle and lateral cuneiform articulations ?with the second and third metatarsals. Findings could be ?posttraumatic, inflammatory arthropathy or less likely neuropathic ?disease. ?3. Small healed appearing osteochondral lesion involving the medial ?talar dome. ?  ?  ?Electronically Signed ?  By: Marijo Sanes M.D. ?  On: 12/17/2020 08:34  ? ?Study Result ? ?Narrative & Impression  ?CLINICAL DATA:  Left foot and ankle pain for 1-2 years. Pain is ?centered over the dorsum of the foot and has markedly worsened over ?the past 3 weeks. Question tendon tear. ?  ?EXAM: ?MRI OF THE LEFT ANKLE WITHOUT CONTRAST ?  ?TECHNIQUE: ?Multiplanar, multisequence MR imaging of the ankle was performed. No ?intravenous contrast was administered. ?  ?COMPARISON:  Plain films right foot 03/27/2019. ?  ?FINDINGS: ?TENDONS ?  ?Peroneal: Intact. ?  ?Posteromedial: Intact. ?  ?Anterior: Intact. There is some fluid about the extensor digitorum ?brevis tendons at the level of the tibiotalar joint compatible with ?tendinosis. ?  ?Achilles: Intact. ?  ?Plantar Fascia: Very small partial thickness tear of the plantar ?fascia is seen centrally. The plantar fascia otherwise appears ?normal. ?  ?LIGAMENTS ?  ?Lateral: Intact. ?  ?Medial:  Intact. ?  ?CARTILAGE ?  ?Ankle Joint: Osteochondral lesion of the medial talar dome measures ?0.5 cm transverse by 0.9 cm AP. Overlying cartilage is mildly ?thinned. Small cyst is seen  subjacent to the lesion. ?  ?Subtalar Joints/Sinus Tarsi: Normal. ?  ?Bones: The patient has advanced midfoot osteoarthritis with ?subchondral edema and small cyst worst at the articulation of the ?navicular and lateral cuneiform and about the second tarsometatarsal ?joint. ?  ?Other: None. ?  ?IMPRESSION: ?Dominant finding is midfoot osteoarthritis which appears severe at ?the articulation of the navicular and lateral cuneiform and second ?tarsometatarsal joint. ?  ?Very small partial thickness tear of the central fibers the plantar ?fascia. ?  ?Osteochondral lesion of the medial talar dome. Small underlying ?subchondral cyst is identified. ?  ?Fluid about the extensor digitorum brevis tendons compatible with ?tenosynovitis. Negative for tendon tear. ?  ?  ?Electronically Signed ?  By: Inge Rise M.D. ?  On: 07/03/2019 15:59 ?   ? ? ?Assessment:  ? ?1. Osteochondral lesion of talar dome   ?2. Impingement syndrome of left ankle   ?3. Ganglion of left ankle   ? ? ? ?Plan:  ?Patient was evaluated and treated and all questions answered. ? ?We again reviewed her previous radiographic findings as well as today's clinical findings in detail.  We discussed the presence of the osteochondral lesion her pain is not consistent with this today but this could be contributing.  I do not think this is the midtarsal arthritis that is causing most of her pain.  She does have a small fluctuant area that could be consistent with a ganglion cyst deep to the extensor tendons as well as anterolateral impingement.  We discussed arthroscopic treatment or excision of any possible cyst here.  I think prior to any surgical consultation and planning we should evaluate with new updated imaging.  Her last CT scan was in January 2022, her previous MRI was done in 2020.  An MRI will give [place] further detail for any cyst or impingement and I have ordered this and we will see her back after. ? ? ?Return for after MRI to review.  ?

## 2022-03-28 NOTE — Patient Instructions (Signed)
[facility] ? ?[address] ?[address] ?[place], [place] [address]  ?Phone [phone] ? ?Fax [phone]  ?

## 2022-04-04 ENCOUNTER — Ambulatory Visit
Admission: RE | Admit: 2022-04-04 | Discharge: 2022-04-04 | Disposition: A | Discharge status: Home / Self Care | Payer: 59 | Source: Ambulatory Visit | Attending: Podiatry | Admitting: Podiatry

## 2022-04-04 DIAGNOSIS — M67472 Ganglion, left ankle and foot: Secondary | ICD-10-CM

## 2022-04-04 DIAGNOSIS — M25872 Other specified joint disorders, left ankle and foot: Secondary | ICD-10-CM

## 2022-04-04 DIAGNOSIS — M899 Disorder of bone, unspecified: Secondary | ICD-10-CM

## 2022-04-08 ENCOUNTER — Ambulatory Visit (INDEPENDENT_AMBULATORY_CARE_PROVIDER_SITE_OTHER): Payer: 59

## 2022-04-08 DIAGNOSIS — M778 Other enthesopathies, not elsewhere classified: Secondary | ICD-10-CM

## 2022-04-08 DIAGNOSIS — M19072 Primary osteoarthritis, left ankle and foot: Secondary | ICD-10-CM

## 2022-04-08 NOTE — Progress Notes (Signed)
SITUATION: ?Reason for Visit: Fitting and Delivery of Custom Fabricated Foot Orthoses ?Patient Report: Patient reports comfort and is satisfied with device. ? ?OBJECTIVE DATA: ?Patient History / Diagnosis:   ?  ICD-10-CM   ?1. Capsulitis of foot, left  M77.8   ?  ?2. Osteoarthritis of left ankle and foot  M19.072   ?  ? ? ?Provided Device:  Custom Functional Foot Orthotics ?    [employer]: [id number] ? ?GOAL OF ORTHOSIS ?- Improve gait ?- Decrease energy expenditure ?- Improve Balance ?- Provide Triplanar stability of foot complex ?- Facilitate motion ? ?ACTIONS PERFORMED ?Patient was fit with foot orthotics trimmed to shoe last. Patient tolerated fittign procedure.  ? ?Patient was provided with verbal and written instruction and demonstration regarding donning, doffing, wear, care, proper fit, function, purpose, cleaning, and use of the orthosis and in all related precautions and risks and benefits regarding the orthosis. ? ?Patient was also provided with verbal instruction regarding how to report any failures or malfunctions of the orthosis and necessary follow up care. Patient was also instructed to contact our office regarding any change in status that may affect the function of the orthosis. ? ?Patient demonstrated independence with proper donning, doffing, and fit and verbalized understanding of all instructions. ? ?PLAN: ?Patient is to follow up in one week or as necessary (PRN). All questions were answered and concerns addressed. Plan of care was discussed with and agreed upon by the patient. ? ?

## 2022-04-11 ENCOUNTER — Encounter: Payer: Self-pay | Admitting: Podiatry

## 2022-04-11 ENCOUNTER — Ambulatory Visit: Payer: 59 | Admitting: Podiatry

## 2022-04-11 DIAGNOSIS — M67472 Ganglion, left ankle and foot: Secondary | ICD-10-CM | POA: Diagnosis not present

## 2022-04-11 DIAGNOSIS — M19072 Primary osteoarthritis, left ankle and foot: Secondary | ICD-10-CM | POA: Diagnosis not present

## 2022-04-11 NOTE — Progress Notes (Signed)
?Subjective:  ?Patient ID: Pamela Jacobs, female    DOB: Jan 18, 1956,  MRN: [id number] ? ?Chief Complaint  ?Patient presents with  ? osteochondral lesion of talar dome  ?  MRI results  ? ? ?66 y.o. female returns for follow-up of left ankle and foot pain.  Still very painful.  She completed the MRI ? ? ?Objective:  ?Physical Exam: ?warm, good capillary refill, no trophic changes or ulcerative lesions, normal DP and PT pulses and normal sensory exam. ?Left Foot:  pain in the midtarsal joint, anterior lateral ankle and sinus tarsi area there is pain, there is a small fluctuant area that is consistent with a mass, dorsiflexion and pressure here causes impinging pain. ? ?Study Result ? ?Narrative & Impression  ?CLINICAL DATA:  Chronic foot pain. ?  ?EXAM: ?CT OF THE LEFT FOOT WITHOUT CONTRAST ?  ?TECHNIQUE: ?Multidetector CT imaging of the left foot was performed according to ?the standard protocol. Multiplanar CT image reconstructions were ?also generated. ?  ?COMPARISON:  MRI 07/03/2019 ?  ?FINDINGS: ?The tibiotalar joint is fairly well maintained. No significant ?degenerative changes or joint effusion. There is a small healed ?appearing osteochondral lesion involving the medial talar dome. ?  ?The subtalar joints are maintained. Mild degenerative changes. No ?tarsal coalition. ?  ?As demonstrated on the prior MRI there is fairly severe midfoot ?degenerative changes with joint space narrowing and osteophytic ?spurring. There are also areas of subchondral cystic type change. ?Could not exclude small erosions. Partial auto fusion changes are ?noted at the tarsal metatarsal joints most notably involving the ?middle and lateral cuneiform articulations with the second and third ?metatarsals. Findings could be posttraumatic or possibly due to an ?inflammatory arthropathy. I do not see any calcified vessels to ?suggest this is diabetic neuropathy but neuropathic disease would ?certainly be a consideration. ?  ?The sinus  tarsi is unremarkable. Grossly by CT the major ligaments ?and tendons appear intact. A small calcaneal heel spur is noted. ?  ?Grossly normal ankle and foot musculature. ?  ?IMPRESSION: ?1. Fairly severe midfoot degenerative changes with joint space ?narrowing and osteophytic spurring. Could not exclude small ?erosions. ?2. Partial auto fusion changes at the tarsal metatarsal joints most ?notably involving the middle and lateral cuneiform articulations ?with the second and third metatarsals. Findings could be ?posttraumatic, inflammatory arthropathy or less likely neuropathic ?disease. ?3. Small healed appearing osteochondral lesion involving the medial ?talar dome. ?  ?  ?Electronically Signed ?  By: Marijo Sanes M.D. ?  On: 12/17/2020 08:34  ? ?Study Result ? ?Narrative & Impression  ?CLINICAL DATA:  Left foot and ankle pain for 1-2 years. Pain is ?centered over the dorsum of the foot and has markedly worsened over ?the past 3 weeks. Question tendon tear. ?  ?EXAM: ?MRI OF THE LEFT ANKLE WITHOUT CONTRAST ?  ?TECHNIQUE: ?Multiplanar, multisequence MR imaging of the ankle was performed. No ?intravenous contrast was administered. ?  ?COMPARISON:  Plain films right foot 03/27/2019. ?  ?FINDINGS: ?TENDONS ?  ?Peroneal: Intact. ?  ?Posteromedial: Intact. ?  ?Anterior: Intact. There is some fluid about the extensor digitorum ?brevis tendons at the level of the tibiotalar joint compatible with ?tendinosis. ?  ?Achilles: Intact. ?  ?Plantar Fascia: Very small partial thickness tear of the plantar ?fascia is seen centrally. The plantar fascia otherwise appears ?normal. ?  ?LIGAMENTS ?  ?Lateral: Intact. ?  ?Medial: Intact. ?  ?CARTILAGE ?  ?Ankle Joint: Osteochondral lesion of the medial talar dome measures ?0.5 cm transverse by  0.9 cm AP. Overlying cartilage is mildly ?thinned. Small cyst is seen subjacent to the lesion. ?  ?Subtalar Joints/Sinus Tarsi: Normal. ?  ?Bones: The patient has advanced midfoot osteoarthritis  with ?subchondral edema and small cyst worst at the articulation of the ?navicular and lateral cuneiform and about the second tarsometatarsal ?joint. ?  ?Other: None. ?  ?IMPRESSION: ?Dominant finding is midfoot osteoarthritis which appears severe at ?the articulation of the navicular and lateral cuneiform and second ?tarsometatarsal joint. ?  ?Very small partial thickness tear of the central fibers the plantar ?fascia. ?  ?Osteochondral lesion of the medial talar dome. Small underlying ?subchondral cyst is identified. ?  ?Fluid about the extensor digitorum brevis tendons compatible with ?tenosynovitis. Negative for tendon tear. ?  ?  ?Electronically Signed ?  By: Inge Rise M.D. ?  On: 07/03/2019 15:59 ?   ? ?Study Result ? ?Narrative & Impression  ?CLINICAL DATA:  Ankle pain. ?  ?EXAM: ?MRI OF THE LEFT ANKLE WITHOUT CONTRAST ?  ?TECHNIQUE: ?Multiplanar, multisequence MR imaging of the ankle was performed. No ?intravenous contrast was administered. ?  ?COMPARISON:  None Available. ?  ?FINDINGS: ?TENDONS ?  ?Peroneal: Peroneal longus tendon intact. Mild tendinosis of the ?peroneus brevis with a short-segment longitudinal split tear. Mild ?peroneal tenosynovitis. ?  ?Posteromedial: Posterior tibial tendon intact. Flexor hallucis ?longus tendon intact. Flexor digitorum longus tendon intact. Small ?amount of fluid in the posterior tibial tendon sheath likely ?reflecting mild tenosynovitis. ?  ?Anterior: Tibialis anterior tendon intact. Extensor hallucis longus ?tendon intact Extensor digitorum longus tendon intact. ?  ?Achilles:  Intact. ?  ?Plantar Fascia: Intact. ?  ?LIGAMENTS ?  ?Lateral: Anterior talofibular ligament intact. Calcaneofibular ?ligament intact. Posterior talofibular ligament intact. Anterior and ?posterior tibiofibular ligaments intact. ?  ?Medial: Deltoid ligament intact. Spring ligament intact. ?  ?CARTILAGE ?  ?Ankle Joint: No joint effusion. 5 mm osteochondral lesion involving ?the medial  corner of the talar dome with subchondral marrow edema ?and cystic changes and overlying cartilage fissuring. ?  ?Subtalar Joints/Sinus Tarsi: Normal subtalar joints. No subtalar ?joint effusion. Normal sinus tarsi. ?  ?Bones: No aggressive osseous lesion. No fracture or dislocation. ?Severe osteoarthritis with subchondral marrow edema involving the ?navicular-medial cuneiform, middle cuneiform, lateral cuneiform most ?severe at the navicular-lateral cuneiform articulation. Severe ?osteoarthritis of the second tarsometatarsal joint. Mild ?osteoarthritis of the fifth tarsometatarsal joint. Moderate ?osteoarthritis of the third tarsometatarsal joint. ?  ?Soft Tissue: No fluid collection or hematoma. Muscles are normal ?without edema or atrophy. Tarsal tunnel is normal. 10 mm ganglion ?cyst along the dorsal aspect of the third TMT joint. ?  ?IMPRESSION: ?1. Mild tendinosis of the peroneus brevis with a short-segment ?longitudinal split tear. Mild peroneal tenosynovitis. ?2. Severe osteoarthritis with subchondral marrow edema involving the ?navicular-medial cuneiform, middle cuneiform, lateral cuneiform most ?severe at the navicular-lateral cuneiform articulation. ?3. 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 ?4. Mild osteoarthritis of the fifth tarsometatarsal joint. Moderate ?osteoarthritis of the third tarsometatarsal joint. ?5. A 5 mm osteochondral lesion involving the medial corner of the ?talar dome with subchondral marrow edema and cystic changes and ?overlying cartilage fissuring. ?  ?  ?Electronically Signed ?  By: Kathreen Devoid M.D. ?  On: 04/04/2022 10:02  ? ?Assessment:  ? ?1. Osteoarthritis of left ankle and foot   ?2. Ganglion of left ankle   ? ? ? ?Plan:  ?Patient was evaluated and treated and all questions answered. ? ?Today reviewed the MRI with her husband and her in detail.  We discussed that the most the pain is in fact from the navicular cuneiform severe osteoarthritis.  There is a  ganglion cyst in the area which on the MRI is near the third tarsometatarsal joint I expect this is along the extensor tendons and pressure of this corresponds with her clinical exam is likely traveling up and

## 2022-04-28 ENCOUNTER — Other Ambulatory Visit: Payer: Self-pay | Admitting: Obstetrics and Gynecology

## 2022-04-28 DIAGNOSIS — Z79890 Hormone replacement therapy (postmenopausal): Secondary | ICD-10-CM

## 2022-05-23 ENCOUNTER — Ambulatory Visit
Admission: RE | Admit: 2022-05-23 | Discharge: 2022-05-23 | Disposition: A | Discharge status: Home / Self Care | Payer: 59 | Source: Ambulatory Visit | Attending: Obstetrics and Gynecology | Admitting: Obstetrics and Gynecology

## 2022-05-23 DIAGNOSIS — Z01419 Encounter for gynecological examination (general) (routine) without abnormal findings: Secondary | ICD-10-CM | POA: Diagnosis present

## 2022-05-23 DIAGNOSIS — Z1231 Encounter for screening mammogram for malignant neoplasm of breast: Secondary | ICD-10-CM | POA: Diagnosis present

## 2022-06-13 ENCOUNTER — Telehealth: Payer: Self-pay | Admitting: Podiatry

## 2022-06-13 NOTE — Telephone Encounter (Signed)
Patient came in today and gave me her new insurance cards. Patient is having surgery in October.

## 2022-06-30 DIAGNOSIS — K219 Gastro-esophageal reflux disease without esophagitis: Secondary | ICD-10-CM | POA: Diagnosis not present

## 2022-06-30 DIAGNOSIS — I872 Venous insufficiency (chronic) (peripheral): Secondary | ICD-10-CM | POA: Diagnosis not present

## 2022-06-30 DIAGNOSIS — E669 Obesity, unspecified: Secondary | ICD-10-CM | POA: Diagnosis not present

## 2022-06-30 DIAGNOSIS — E559 Vitamin D deficiency, unspecified: Secondary | ICD-10-CM | POA: Diagnosis not present

## 2022-06-30 DIAGNOSIS — J309 Allergic rhinitis, unspecified: Secondary | ICD-10-CM | POA: Diagnosis not present

## 2022-07-15 DIAGNOSIS — K219 Gastro-esophageal reflux disease without esophagitis: Secondary | ICD-10-CM | POA: Diagnosis not present

## 2022-07-15 DIAGNOSIS — Z6836 Body mass index (BMI) 36.0-36.9, adult: Secondary | ICD-10-CM | POA: Diagnosis not present

## 2022-07-15 DIAGNOSIS — I10 Essential (primary) hypertension: Secondary | ICD-10-CM | POA: Diagnosis not present

## 2022-07-15 DIAGNOSIS — Z780 Asymptomatic menopausal state: Secondary | ICD-10-CM | POA: Diagnosis not present

## 2022-07-17 ENCOUNTER — Encounter: Payer: Self-pay | Admitting: Podiatry

## 2022-07-26 DIAGNOSIS — C44311 Basal cell carcinoma of skin of nose: Secondary | ICD-10-CM | POA: Diagnosis not present

## 2022-07-26 DIAGNOSIS — D485 Neoplasm of uncertain behavior of skin: Secondary | ICD-10-CM | POA: Diagnosis not present

## 2022-08-10 ENCOUNTER — Ambulatory Visit (INDEPENDENT_AMBULATORY_CARE_PROVIDER_SITE_OTHER): Payer: PPO | Admitting: Podiatry

## 2022-08-10 DIAGNOSIS — L853 Xerosis cutis: Secondary | ICD-10-CM | POA: Diagnosis not present

## 2022-08-10 DIAGNOSIS — L600 Ingrowing nail: Secondary | ICD-10-CM | POA: Diagnosis not present

## 2022-08-10 NOTE — Progress Notes (Signed)
  Subjective:  Patient ID: Pamela Jacobs, female    DOB: May 20, 1956,  MRN: [id number]  Chief Complaint  Patient presents with   Nail Problem     left great toenail removed in dec, growing back deep on one side/ right foot toe possible athlete's foot    66 y.o. female presents with the above complaint. History confirmed with patient.   Objective:  Physical Exam: warm, good capillary refill, no trophic changes or ulcerative lesions, normal DP and PT pulses, normal sensory exam, and left hallux nail dystrophic growing back, does not appear to be ingrown or paronychia at this point, she has some xerosis cutis plantar foot on the right does not itch no eruption or rash Assessment:   1. Ingrowing left great toenail   2. Xerosis cutis      Plan:  Patient was evaluated and treated and all questions answered.  Discussed skin hydration and moisturizing lotion she will use with the right side.  I recommended [employer] diabetic foot lotion, did not appear to be consistent with a tinea pedis requiring any medications  Regarding her nail dystrophy the nail appears to be growing back uneventfully.  There is no current evidence of ingrown or paronychia.  I advised her to continue to monitor this in the event that it does become ingrown.  If it worsens we will get her in for treatment antibiotics prior to her surgery we do not want this to delay her surgical plan.  No follow-ups on file.

## 2022-08-11 DIAGNOSIS — H11122 Conjunctival concretions, left eye: Secondary | ICD-10-CM | POA: Diagnosis not present

## 2022-08-15 DIAGNOSIS — C44311 Basal cell carcinoma of skin of nose: Secondary | ICD-10-CM | POA: Diagnosis not present

## 2022-08-15 DIAGNOSIS — L988 Other specified disorders of the skin and subcutaneous tissue: Secondary | ICD-10-CM | POA: Diagnosis not present

## 2022-08-15 DIAGNOSIS — L578 Other skin changes due to chronic exposure to nonionizing radiation: Secondary | ICD-10-CM | POA: Diagnosis not present

## 2022-08-15 DIAGNOSIS — L814 Other melanin hyperpigmentation: Secondary | ICD-10-CM | POA: Diagnosis not present

## 2022-08-18 ENCOUNTER — Telehealth: Payer: Self-pay

## 2022-08-18 NOTE — Telephone Encounter (Signed)
DOS 09/09/2022  ARTHRODESIS LT - 05025 EXC GANGLION CYST LT - 28090 BONE GRAFT LT -  20900  RECEIVED [address] FAX FROM [facility] [employer]  AUTH # [id number] APPROVED CPT [phone], [phone] & 20900  GOOD FROM 09/09/2022 - 12/08/2022

## 2022-09-09 ENCOUNTER — Other Ambulatory Visit: Payer: Self-pay | Admitting: Podiatry

## 2022-09-09 DIAGNOSIS — M65872 Other synovitis and tenosynovitis, left ankle and foot: Secondary | ICD-10-CM | POA: Diagnosis not present

## 2022-09-09 DIAGNOSIS — M67472 Ganglion, left ankle and foot: Secondary | ICD-10-CM | POA: Diagnosis not present

## 2022-09-09 DIAGNOSIS — G8918 Other acute postprocedural pain: Secondary | ICD-10-CM | POA: Diagnosis not present

## 2022-09-09 DIAGNOSIS — M89772 Major osseous defect, left ankle and foot: Secondary | ICD-10-CM | POA: Diagnosis not present

## 2022-09-09 DIAGNOSIS — M19272 Secondary osteoarthritis, left ankle and foot: Secondary | ICD-10-CM | POA: Diagnosis not present

## 2022-09-09 MED ORDER — OXYCODONE HCL 5 MG PO TABS: 5.0000 mg | ORAL_TABLET | ORAL | 0 refills | Status: AC | PRN

## 2022-09-09 MED ORDER — RIVAROXABAN 10 MG PO TABS: 10.0000 mg | ORAL_TABLET | Freq: Every day | ORAL | 0 refills | Status: DC

## 2022-09-09 MED ORDER — GABAPENTIN 300 MG PO CAPS: 300.0000 mg | ORAL_CAPSULE | Freq: Three times a day (TID) | ORAL | 0 refills | Status: DC

## 2022-09-09 MED ORDER — ACETAMINOPHEN 500 MG PO TABS: 1000.0000 mg | ORAL_TABLET | Freq: Four times a day (QID) | ORAL | 0 refills | Status: AC | PRN

## 2022-09-09 NOTE — Progress Notes (Signed)
09/09/22 left midfoot fusion

## 2022-09-14 ENCOUNTER — Ambulatory Visit (INDEPENDENT_AMBULATORY_CARE_PROVIDER_SITE_OTHER): Payer: PPO | Admitting: Podiatry

## 2022-09-14 ENCOUNTER — Ambulatory Visit (INDEPENDENT_AMBULATORY_CARE_PROVIDER_SITE_OTHER): Payer: PPO

## 2022-09-14 DIAGNOSIS — M19072 Primary osteoarthritis, left ankle and foot: Secondary | ICD-10-CM | POA: Diagnosis not present

## 2022-09-14 MED ORDER — FLUCONAZOLE 150 MG PO TABS: 150.0000 mg | ORAL_TABLET | Freq: Once | ORAL | 0 refills | Status: DC | PRN

## 2022-09-14 MED ORDER — CEPHALEXIN 500 MG PO CAPS: 500.0000 mg | ORAL_CAPSULE | Freq: Three times a day (TID) | ORAL | 0 refills | Status: AC

## 2022-09-14 NOTE — Progress Notes (Signed)
  Subjective:  Patient ID: Pamela Jacobs, female    DOB: 05-12-1956,  MRN: [id number]  Chief Complaint  Patient presents with   Routine Post Op    POV #1 DOS 09/09/2022 FUSION OF JOINTS OF MIDFOOT, BONEGRAFT FROM HEEL, REMOVAL OF GANGLION CYST LT FOOT     66 y.o. female returns for post-op check.  Overall doing well she had some comfort in the heel itself posteriorly not on the incision  Review of Systems: Negative except as noted in the HPI. Denies N/V/F/Ch.   Objective:  There were no vitals filed for this visit. There is no height or weight on file to calculate BMI. Constitutional Well developed. Well nourished.  Vascular Foot warm and well perfused. Capillary refill normal to all digits.  Calf is soft and supple, no posterior calf or knee pain, negative Homans' sign  Neurologic Normal speech. Oriented to person, place, and time. Epicritic sensation to light touch grossly present bilaterally.  Dermatologic Skin healing well without signs of infection. Skin edges well coapted without signs of infection.  There is erythema adjacent to the incision that does not extend and does not appear to be cellulitic appears to be inflammatory  Orthopedic: Tenderness to palpation noted about the surgical site.   Multiple view plain film radiographs: Good apposition of the fusion sites, all hardware intact and equivalent to immediate postoperative films Assessment:   1. Osteoarthritis of left ankle and foot    Plan:  Patient was evaluated and treated and all questions answered.  S/p foot surgery left -Progressing as expected post-operatively.  She did have some erythema which I suspect is likely inflammatory and could be secondary to dressing pressure.  I did recommend however prophylactically treating with antibiotics and Keflex Rx was sent to pharmacy -XR: Noted above -WB Status: NWB in posterior splint -Sutures: Plan to remove in 2 weeks. -Medications: Keflex 3 times daily x14 days  sent to pharmacy.  Diflucan x1 sent in case she develops any yeast infection.  She has been taking ibuprofen at home I recommended she discontinue this and stick with only Tylenol and oxycodone, she will complete the gabapentin and continue her Xarelto -Foot redressed.  Povidone ointment applied to incisions.  Well-padded below-knee splint was applied  Return in about 2 weeks (around 09/28/2022) for post op (no x-rays), suture removal.

## 2022-09-16 ENCOUNTER — Telehealth: Payer: Self-pay | Admitting: Podiatry

## 2022-09-16 NOTE — Telephone Encounter (Signed)
Patients husband came in to see if he could get handicap placard signature. Pt had surgery.

## 2022-09-19 NOTE — Telephone Encounter (Signed)
Signed and stamped form gave to husband.

## 2022-09-23 ENCOUNTER — Telehealth: Payer: Self-pay | Admitting: *Deleted

## 2022-09-23 NOTE — Telephone Encounter (Signed)
Patient has been updated.

## 2022-09-23 NOTE — Telephone Encounter (Signed)
Patient is calling because after taking cephalexin for ten days, is having extreme diarrhea, can something else be prescribed? what should she do at this point?

## 2022-09-23 NOTE — Telephone Encounter (Signed)
Patient called back and said that she had discontinued the medicine and that she has not taking any other medicines since starting the cephalexin.

## 2022-09-28 ENCOUNTER — Ambulatory Visit (INDEPENDENT_AMBULATORY_CARE_PROVIDER_SITE_OTHER): Payer: PPO | Admitting: Podiatry

## 2022-09-28 DIAGNOSIS — R2689 Other abnormalities of gait and mobility: Secondary | ICD-10-CM

## 2022-09-28 DIAGNOSIS — M19072 Primary osteoarthritis, left ankle and foot: Secondary | ICD-10-CM

## 2022-09-28 NOTE — Progress Notes (Signed)
  Subjective:  Patient ID: Pamela Jacobs, female    DOB: 11/07/1956,  MRN: [id number]  Chief Complaint  Patient presents with   Routine Post Op       POV #2 DOS 09/09/2022 FUSION OF JOINTS OF MIDFOOT, BONEGRAFT FROM HEEL, REMOVAL OF GANGLION CYST LT FOOT     66 y.o. female returns for post-op check.  Continues to do well  Review of Systems: Negative except as noted in the HPI. Denies N/V/F/Ch.   Objective:  There were no vitals filed for this visit. There is no height or weight on file to calculate BMI. Constitutional Well developed. Well nourished.  Vascular Foot warm and well perfused. Capillary refill normal to all digits.  Calf is soft and supple, no posterior calf or knee pain, negative Homans' sign  Neurologic Normal speech. Oriented to person, place, and time. Epicritic sensation to light touch grossly present bilaterally.  Dermatologic Incision is well-healed and there are no signs of infection or cellulitis  Orthopedic: Tenderness to palpation noted about the surgical site.   Multiple view plain film radiographs: Good apposition of the fusion sites, all hardware intact and equivalent to immediate postoperative films Assessment:   1. Osteoarthritis of left ankle and foot    Plan:  Patient was evaluated and treated and all questions answered.  S/p foot surgery left -Doing much better she has completed her antibiotics there are no signs of infection.  Incisions were healed enough that sutures were removed today.  We transitioned her to a below-knee cast.  I will see her back in 5 weeks for new x-rays and then transition to weightbearing in a cam walker boot.  She will continue nonweightbearing until that point.  She may return to work.  We will see if there is availability for home physical therapy to work on strengthening  Return in about 5 weeks (around 11/02/2022) for post op (new x-rays), cast removal and go to boot .

## 2022-10-04 NOTE — Progress Notes (Signed)
Referral was faxed over to [facility]

## 2022-10-05 ENCOUNTER — Telehealth: Payer: Self-pay

## 2022-10-05 NOTE — Telephone Encounter (Signed)
Duplicate encounter entered in error

## 2022-10-06 NOTE — Telephone Encounter (Signed)
Referral has been sent for response from [facility] and we can go from there.

## 2022-10-11 DIAGNOSIS — M1711 Unilateral primary osteoarthritis, right knee: Secondary | ICD-10-CM | POA: Diagnosis not present

## 2022-10-18 DIAGNOSIS — I10 Essential (primary) hypertension: Secondary | ICD-10-CM | POA: Diagnosis not present

## 2022-10-19 ENCOUNTER — Encounter: Payer: 59 | Admitting: Podiatry

## 2022-10-24 DIAGNOSIS — Z85828 Personal history of other malignant neoplasm of skin: Secondary | ICD-10-CM | POA: Diagnosis not present

## 2022-10-25 DIAGNOSIS — I10 Essential (primary) hypertension: Secondary | ICD-10-CM | POA: Diagnosis not present

## 2022-10-25 DIAGNOSIS — Z0000 Encounter for general adult medical examination without abnormal findings: Secondary | ICD-10-CM | POA: Diagnosis not present

## 2022-10-25 DIAGNOSIS — K219 Gastro-esophageal reflux disease without esophagitis: Secondary | ICD-10-CM | POA: Diagnosis not present

## 2022-10-25 DIAGNOSIS — E6609 Other obesity due to excess calories: Secondary | ICD-10-CM | POA: Diagnosis not present

## 2022-10-25 DIAGNOSIS — Z1159 Encounter for screening for other viral diseases: Secondary | ICD-10-CM | POA: Diagnosis not present

## 2022-10-25 DIAGNOSIS — Z6834 Body mass index (BMI) 34.0-34.9, adult: Secondary | ICD-10-CM | POA: Diagnosis not present

## 2022-11-02 ENCOUNTER — Ambulatory Visit (INDEPENDENT_AMBULATORY_CARE_PROVIDER_SITE_OTHER): Payer: PPO | Admitting: Podiatry

## 2022-11-02 ENCOUNTER — Ambulatory Visit (INDEPENDENT_AMBULATORY_CARE_PROVIDER_SITE_OTHER): Payer: PPO

## 2022-11-02 DIAGNOSIS — M949 Disorder of cartilage, unspecified: Secondary | ICD-10-CM | POA: Diagnosis not present

## 2022-11-02 DIAGNOSIS — M25872 Other specified joint disorders, left ankle and foot: Secondary | ICD-10-CM

## 2022-11-02 DIAGNOSIS — M19072 Primary osteoarthritis, left ankle and foot: Secondary | ICD-10-CM

## 2022-11-02 DIAGNOSIS — M899 Disorder of bone, unspecified: Secondary | ICD-10-CM

## 2022-11-03 ENCOUNTER — Telehealth: Payer: Self-pay | Admitting: Podiatry

## 2022-11-03 MED ORDER — MUPIROCIN 2 % EX OINT: 1.0000 | TOPICAL_OINTMENT | Freq: Every day | CUTANEOUS | 2 refills | Status: DC

## 2022-11-03 NOTE — Addendum Note (Signed)
Addended bySherryle Lis, Hayat Warbington R on: 11/03/2022 04:14 PM   Modules accepted: Orders

## 2022-11-03 NOTE — Telephone Encounter (Signed)
Pt states that Dr Sherryle Lis was going to send in a topical steroid medication for her foot. Nothing is at the pharmacy.  [id number] Pamela Jacobs, [place]   Please advise

## 2022-11-06 NOTE — Progress Notes (Signed)
  Subjective:  Patient ID: Pamela Jacobs, female    DOB: January 02, 1956,  MRN: [id number]  Chief Complaint  Patient presents with   Routine Post Op    POV #3 DOS 09/09/2022 FUSION OF JOINTS OF MIDFOOT, BONEGRAFT FROM HEEL, REMOVAL OF GANGLION CYST LT FOOT      66 y.o. female returns for post-op check.  Continues to do well from a pain standpoint  Review of Systems: Negative except as noted in the HPI. Denies N/V/F/Ch.   Objective:  There were no vitals filed for this visit. There is no height or weight on file to calculate BMI. Constitutional Well developed. Well nourished.  Vascular Foot warm and well perfused. Capillary refill normal to all digits.  Calf is soft and supple, no posterior calf or knee pain, negative Homans' sign  Neurologic Normal speech. Oriented to person, place, and time. Epicritic sensation to light touch grossly present bilaterally.  Dermatologic Small area of scab still present on the medial incision remainder is well-healed  Orthopedic: Tenderness to palpation noted about the surgical site.   Multiple view plain film radiographs: Good early fusion of bone noted across the arthrodesis sites, no complication of hardware or alignment Assessment:   1. Osteoarthritis of left ankle and foot   2. Osteochondral lesion of talar dome   3. Impingement syndrome of left ankle    Plan:  Patient was evaluated and treated and all questions answered.  S/p foot surgery left -Overall doing fairly well she shows good bone healing on her radiographs and no complication of hardware.  Will transition her to a walking CAM boot.  This was dispensed today.  I will see her back in 1 month for follow-up x-rays.There is a small area of delayed healing and scab she will apply mupirocin ointment daily.  This was sent to her pharmacy.  Educated on signs symptoms of infection she will me know if these develop.  Currently there are none  Return in about 4 weeks (around 11/30/2022) for post  op w/new xrays.

## 2022-11-11 DIAGNOSIS — M25661 Stiffness of right knee, not elsewhere classified: Secondary | ICD-10-CM | POA: Diagnosis not present

## 2022-11-11 DIAGNOSIS — M6281 Muscle weakness (generalized): Secondary | ICD-10-CM | POA: Diagnosis not present

## 2022-11-11 DIAGNOSIS — G8929 Other chronic pain: Secondary | ICD-10-CM | POA: Diagnosis not present

## 2022-11-11 DIAGNOSIS — M25561 Pain in right knee: Secondary | ICD-10-CM | POA: Diagnosis not present

## 2022-11-11 DIAGNOSIS — M1711 Unilateral primary osteoarthritis, right knee: Secondary | ICD-10-CM | POA: Diagnosis not present

## 2022-11-16 DIAGNOSIS — D2261 Melanocytic nevi of right upper limb, including shoulder: Secondary | ICD-10-CM | POA: Diagnosis not present

## 2022-11-16 DIAGNOSIS — M1711 Unilateral primary osteoarthritis, right knee: Secondary | ICD-10-CM | POA: Diagnosis not present

## 2022-11-16 DIAGNOSIS — M25661 Stiffness of right knee, not elsewhere classified: Secondary | ICD-10-CM | POA: Diagnosis not present

## 2022-11-16 DIAGNOSIS — D225 Melanocytic nevi of trunk: Secondary | ICD-10-CM | POA: Diagnosis not present

## 2022-11-16 DIAGNOSIS — L820 Inflamed seborrheic keratosis: Secondary | ICD-10-CM | POA: Diagnosis not present

## 2022-11-16 DIAGNOSIS — D485 Neoplasm of uncertain behavior of skin: Secondary | ICD-10-CM | POA: Diagnosis not present

## 2022-11-16 DIAGNOSIS — R208 Other disturbances of skin sensation: Secondary | ICD-10-CM | POA: Diagnosis not present

## 2022-11-16 DIAGNOSIS — C44619 Basal cell carcinoma of skin of left upper limb, including shoulder: Secondary | ICD-10-CM | POA: Diagnosis not present

## 2022-11-16 DIAGNOSIS — G8929 Other chronic pain: Secondary | ICD-10-CM | POA: Diagnosis not present

## 2022-11-16 DIAGNOSIS — D2272 Melanocytic nevi of left lower limb, including hip: Secondary | ICD-10-CM | POA: Diagnosis not present

## 2022-11-16 DIAGNOSIS — L538 Other specified erythematous conditions: Secondary | ICD-10-CM | POA: Diagnosis not present

## 2022-11-16 DIAGNOSIS — C44319 Basal cell carcinoma of skin of other parts of face: Secondary | ICD-10-CM | POA: Diagnosis not present

## 2022-11-16 DIAGNOSIS — M25561 Pain in right knee: Secondary | ICD-10-CM | POA: Diagnosis not present

## 2022-11-16 DIAGNOSIS — Z85828 Personal history of other malignant neoplasm of skin: Secondary | ICD-10-CM | POA: Diagnosis not present

## 2022-11-16 DIAGNOSIS — M6281 Muscle weakness (generalized): Secondary | ICD-10-CM | POA: Diagnosis not present

## 2022-11-24 DIAGNOSIS — G8929 Other chronic pain: Secondary | ICD-10-CM | POA: Diagnosis not present

## 2022-11-24 DIAGNOSIS — M6281 Muscle weakness (generalized): Secondary | ICD-10-CM | POA: Diagnosis not present

## 2022-11-24 DIAGNOSIS — M25661 Stiffness of right knee, not elsewhere classified: Secondary | ICD-10-CM | POA: Diagnosis not present

## 2022-11-24 DIAGNOSIS — M1711 Unilateral primary osteoarthritis, right knee: Secondary | ICD-10-CM | POA: Diagnosis not present

## 2022-11-24 DIAGNOSIS — M25561 Pain in right knee: Secondary | ICD-10-CM | POA: Diagnosis not present

## 2022-11-29 ENCOUNTER — Ambulatory Visit
Admission: RE | Admit: 2022-11-29 | Discharge: 2022-11-29 | Disposition: A | Discharge status: Home / Self Care | Payer: Medicare HMO | Source: Ambulatory Visit

## 2022-11-29 VITALS — BP 124/78 | HR 76 | Temp 97.9°F | Resp 18

## 2022-11-29 DIAGNOSIS — J0100 Acute maxillary sinusitis, unspecified: Secondary | ICD-10-CM

## 2022-11-29 DIAGNOSIS — R051 Acute cough: Secondary | ICD-10-CM

## 2022-11-29 MED ORDER — BENZONATATE 200 MG PO CAPS: 200.0000 mg | ORAL_CAPSULE | Freq: Three times a day (TID) | ORAL | 0 refills | Status: DC | PRN

## 2022-11-29 MED ORDER — AMOXICILLIN-POT CLAVULANATE 875-125 MG PO TABS: 1.0000 | ORAL_TABLET | Freq: Two times a day (BID) | ORAL | 0 refills | Status: DC

## 2022-11-29 MED ORDER — AMOXICILLIN-POT CLAVULANATE 875-125 MG PO TABS: 1.0000 | ORAL_TABLET | Freq: Two times a day (BID) | ORAL | 0 refills | Status: AC

## 2022-11-29 NOTE — ED Triage Notes (Signed)
Patient presents to UC cough since Thursday. Taking nyquil and dayquil with no improvement. Negative covid test. Wants to rule out common cold since she works at [employer].  Denies fever.

## 2022-11-29 NOTE — Discharge Instructions (Signed)
Augmentin twice daily for 1 days Tessalon as needed for cough Start Flonase over-the-counter daily Nasal rinses as tolerated Rest and fluids Follow-up with PCP 2 to 3 days for recheck Please go to the emergency room if you have any worsening symptoms

## 2022-11-29 NOTE — ED Provider Notes (Signed)
[name]    CSN: [id number] Arrival date & time: 11/29/22  1104      History   Chief Complaint Chief Complaint  Patient presents with   Nasal Congestion    Already tested for Covid and it is negative. - Entered by patient   Cough    HPI Pamela Jacobs is a 67 y.o. female who presents for evaluation of URI symptoms for 7 days. Patient reports associated symptoms of productive cough with clear phlegm, congestion, postnasal drip, sinus pressure. Denies N/V/D, fevers, sore throat, ear pain, shortness of breath, body aches. Patient does not have a hx of asthma or smoking. No known sick contacts and no recent travel. Pt is vaccinated for COVID. Pt is vaccinated for flu this season.  Reports a negative COVID test at home.  Pt has taken: NyQuil/dayquil OTC for symptoms. Pt has no other concerns at this time.    Cough   Past Medical History:  Diagnosis Date   Arthritis    GERD (gastroesophageal reflux disease)    Skin cancer     Patient Active Problem List   Diagnosis Date Noted   Special screening for malignant neoplasms, colon    Increased BMI 01/12/2016   Menopause 01/12/2016   Urinary urgency 08/10/2015   Nocturia 08/10/2015    Past Surgical History:  Procedure Laterality Date   BREAST EXCISIONAL BIOPSY Left 2000   benign   CESAREAN SECTION  9798,9211   COLONOSCOPY WITH PROPOFOL N/A 07/04/2017   Procedure: COLONOSCOPY WITH PROPOFOL;  Surgeon: Lucilla Lame, MD;  Location: [facility] ENDOSCOPY;  Service: Endoscopy;  Laterality: N/A;   DILATION AND CURETTAGE OF UTERUS     FOOT SURGERY  2012   plate placed   NASAL SEPTUM SURGERY  1975   TUBAL LIGATION      OB History     Gravida  3   Para  2   Term  2   Preterm      AB  1   Living  2      SAB  1   IAB      Ectopic      Multiple      Live Births  2            Home Medications    Prior to Admission medications   Medication Sig Start Date End Date Taking? Authorizing Provider   Calcium Carb-Cholecalciferol 600-10 MG-MCG TABS Take by mouth. 07/15/22  Yes [provider]  losartan (COZAAR) 25 MG tablet TAKE 1 TABLET(25 MG) BY MOUTH AT BEDTIME 07/15/22  Yes [provider]  amoxicillin-clavulanate (AUGMENTIN) 875-125 MG tablet Take 1 tablet by mouth every 12 (twelve) hours for 7 days. 11/29/22 12/06/22  Melynda Ripple, NP  benzonatate (TESSALON) 200 MG capsule Take 1 capsule (200 mg total) by mouth 3 (three) times daily as needed for cough. 11/29/22   Melynda Ripple, NP  estradiol-norethindrone (ACTIVELLA) 1-0.5 MG tablet TAKE 1 TABLET BY MOUTH DAILY 04/28/22 04/23/23  Harlin Heys, MD  fluconazole (DIFLUCAN) 150 MG tablet Take 1 tablet (150 mg total) by mouth once as needed for up to 1 dose (yeast infection). 09/14/22   McDonald, Stephan Minister, DPM  gabapentin (NEURONTIN) 300 MG capsule Take 1 capsule (300 mg total) by mouth 3 (three) times daily for 7 days. 09/09/22 09/16/22  McDonald, Stephan Minister, DPM  mupirocin ointment (BACTROBAN) 2 % Apply 1 Application topically daily. 11/03/22   Criselda Peaches, DPM  omeprazole (PRILOSEC) 40  MG capsule TK 1 C PO D 05/23/19   [provider]  rivaroxaban (XARELTO) 10 MG TABS tablet Take 1 tablet (10 mg total) by mouth daily. 09/09/22 10/09/22  Criselda Peaches, DPM  Vitamin D, Ergocalciferol, (DRISDOL) 50000 UNITS CAPS capsule TAKE 1 CAPSULE BY MOUTH WEEKLY 06/10/15   [provider]    Family History Family History  Problem Relation Age of Onset   Hypertension Father    Bladder Cancer Neg Hx    Kidney cancer Neg Hx    Prostate cancer Neg Hx    Cancer Neg Hx    Diabetes Neg Hx    Heart disease Neg Hx    Ovarian cancer Neg Hx    Breast cancer Neg Hx     Social History Social History   Tobacco Use   Smoking status: Never   Smokeless tobacco: Never  Vaping Use   Vaping Use: Never used  Substance Use Topics   Alcohol use: Yes    Alcohol/week: 0.0 standard drinks of alcohol    Comment: occasionaly    Drug use: No     Allergies   Patient has no known allergies.   Review of Systems Review of Systems  HENT:  Positive for congestion, postnasal drip, sinus pressure and sinus pain.   Respiratory:  Positive for cough.      Physical Exam Triage Vital Signs ED Triage Vitals  Enc Vitals Group     BP 11/29/22 1122 124/78     Pulse Rate 11/29/22 1122 76     Resp 11/29/22 1122 18     Temp 11/29/22 1122 97.9 F (36.6 C)     Temp Source 11/29/22 1122 Oral     SpO2 11/29/22 1122 95 %     Weight --      Height --      Head Circumference --      Peak Flow --      Pain Score 11/29/22 1127 0     Pain Loc --      Pain Edu? --      Excl. in [place]? --    No data found.  Updated Vital Signs BP 124/78 (BP Location: Left Arm)   Pulse 76   Temp 97.9 F (36.6 C) (Oral)   Resp 18   SpO2 95%   Visual Acuity Right Eye Distance:   Left Eye Distance:   Bilateral Distance:    Right Eye Near:   Left Eye Near:    Bilateral Near:     Physical Exam Vitals and nursing note reviewed.  Constitutional:      General: She is not in acute distress.    Appearance: She is well-developed. She is not ill-appearing.  HENT:     Head: Normocephalic and atraumatic.     Right Ear: Tympanic membrane and ear canal normal.     Left Ear: Tympanic membrane and ear canal normal.     Nose: Congestion present.     Right Turbinates: Swollen and pale.     Left Turbinates: Swollen and pale.     Right Sinus: Maxillary sinus tenderness present. No frontal sinus tenderness.     Left Sinus: Maxillary sinus tenderness present.     Mouth/Throat:     Mouth: Mucous membranes are moist.     Pharynx: Oropharynx is clear. Uvula midline. No oropharyngeal exudate or posterior oropharyngeal erythema.     Tonsils: No tonsillar exudate or tonsillar abscesses.  Eyes:     Conjunctiva/sclera: Conjunctivae normal.  Pupils: Pupils are equal, round, and reactive to light.  Cardiovascular:     Rate and Rhythm: Normal rate  and regular rhythm.     Heart sounds: Normal heart sounds.  Pulmonary:     Effort: Pulmonary effort is normal.     Breath sounds: Normal breath sounds.  Musculoskeletal:     Cervical back: Normal range of motion and neck supple.  Lymphadenopathy:     Cervical: No cervical adenopathy.  Skin:    General: Skin is warm and dry.  Neurological:     General: No focal deficit present.     Mental Status: She is alert and oriented to person, place, and time.  Psychiatric:        Mood and Affect: Mood normal.        Behavior: Behavior normal.      UC Treatments / Results  Labs (all labs ordered are listed, but only abnormal results are displayed) Labs Reviewed - No data to display  EKG   Radiology No results found.  Procedures Procedures (including critical care time)  Medications Ordered in UC Medications - No data to display  Initial Impression / Assessment and Plan / UC Course  I have reviewed the triage vital signs and the nursing notes.  Pertinent labs & imaging results that were available during my care of the patient were reviewed by me and considered in my medical decision making (see chart for details).     Reviewed exam and symptoms with patient.  No red flags on exam. Start Augmentin for sinusitis OTC Flonase daily Tessalon PRN cough Nasal rinses as tolerated Rest and fluids Follow-up with PCP 2 to 3 days for recheck Please go to the emergency room for any worsening symptoms Final Clinical Impressions(s) / UC Diagnoses   Final diagnoses:  Acute maxillary sinusitis, recurrence not specified  Acute cough     Discharge Instructions      Augmentin twice daily for 1 days Tessalon as needed for cough Start Flonase over-the-counter daily Nasal rinses as tolerated Rest and fluids Follow-up with PCP 2 to 3 days for recheck Please go to the emergency room if you have any worsening symptoms    ED Prescriptions     Medication Sig Dispense Auth. Provider    amoxicillin-clavulanate (AUGMENTIN) 875-125 MG tablet  (Status: Discontinued) Take 1 tablet by mouth every 12 (twelve) hours. 14 tablet Melynda Ripple, NP   benzonatate (TESSALON) 200 MG capsule  (Status: Discontinued) Take 1 capsule (200 mg total) by mouth 3 (three) times daily as needed for cough. 20 capsule Melynda Ripple, NP   benzonatate (TESSALON) 200 MG capsule Take 1 capsule (200 mg total) by mouth 3 (three) times daily as needed for cough. 20 capsule Melynda Ripple, NP   amoxicillin-clavulanate (AUGMENTIN) 875-125 MG tablet Take 1 tablet by mouth every 12 (twelve) hours for 7 days. 14 tablet Melynda Ripple, NP      PDMP not reviewed this encounter.   Melynda Ripple, NP 11/29/22 [phone]

## 2022-11-30 ENCOUNTER — Encounter: Payer: PPO | Admitting: Podiatry

## 2022-12-05 ENCOUNTER — Ambulatory Visit (INDEPENDENT_AMBULATORY_CARE_PROVIDER_SITE_OTHER): Payer: PPO

## 2022-12-05 ENCOUNTER — Ambulatory Visit (INDEPENDENT_AMBULATORY_CARE_PROVIDER_SITE_OTHER): Payer: Medicare HMO | Admitting: Podiatry

## 2022-12-05 DIAGNOSIS — M19072 Primary osteoarthritis, left ankle and foot: Secondary | ICD-10-CM

## 2022-12-05 NOTE — Progress Notes (Signed)
  Subjective:  Patient ID: Pamela Jacobs, female    DOB: November 19, 1956,  MRN: [id number]  Chief Complaint  Patient presents with   Routine Post Op    POV DOS 09/09/2022 FUSION OF JOINTS MIDFOOT, BONE GRAFT / X-RAYS     67 y.o. female returns for post-op check.  She is doing well she is still been using the cam boot  Review of Systems: Negative except as noted in the HPI. Denies N/V/F/Ch.   Objective:  There were no vitals filed for this visit. There is no height or weight on file to calculate BMI. Constitutional Well developed. Well nourished.  Vascular Foot warm and well perfused. Capillary refill normal to all digits.  Calf is soft and supple, no posterior calf or knee pain, negative Homans' sign  Neurologic Normal speech. Oriented to person, place, and time. Epicritic sensation to light touch grossly present bilaterally.  Dermatologic Area of delayed healing is now fully healed  Orthopedic: Tenderness to palpation noted about the surgical site.  Mild edema   Multiple view plain film radiographs: Complete fusion across arthrodesis sites noted no complication of hardware or alignment Assessment:   1. Osteoarthritis of left ankle and foot    Plan:  Patient was evaluated and treated and all questions answered.  S/p foot surgery left -Continues to progress she may transition from the cam boot to a regular supportive shoe such as an athletic shoe or sneaker.  Area of delayed healing has completed and she may resume regular bathing at this point.  I will see her back in 3 months for follow-up for final x-rays  Return in about 3 months (around 03/06/2023) for follow up from surgery.

## 2022-12-09 DIAGNOSIS — M6281 Muscle weakness (generalized): Secondary | ICD-10-CM | POA: Diagnosis not present

## 2022-12-09 DIAGNOSIS — M1711 Unilateral primary osteoarthritis, right knee: Secondary | ICD-10-CM | POA: Diagnosis not present

## 2022-12-09 DIAGNOSIS — M25661 Stiffness of right knee, not elsewhere classified: Secondary | ICD-10-CM | POA: Diagnosis not present

## 2022-12-13 ENCOUNTER — Ambulatory Visit: Payer: Medicare HMO | Admitting: Dermatology

## 2022-12-13 DIAGNOSIS — M25561 Pain in right knee: Secondary | ICD-10-CM | POA: Diagnosis not present

## 2022-12-13 DIAGNOSIS — M25661 Stiffness of right knee, not elsewhere classified: Secondary | ICD-10-CM | POA: Diagnosis not present

## 2022-12-13 DIAGNOSIS — M1711 Unilateral primary osteoarthritis, right knee: Secondary | ICD-10-CM | POA: Diagnosis not present

## 2022-12-13 DIAGNOSIS — I8393 Asymptomatic varicose veins of bilateral lower extremities: Secondary | ICD-10-CM

## 2022-12-13 DIAGNOSIS — G8929 Other chronic pain: Secondary | ICD-10-CM | POA: Diagnosis not present

## 2022-12-13 DIAGNOSIS — M6281 Muscle weakness (generalized): Secondary | ICD-10-CM | POA: Diagnosis not present

## 2022-12-13 NOTE — Patient Instructions (Addendum)
BEFORE YOUR APPOINTMENT FOR SCLEROTHERAPY  1. When you telephone for your appointment for the sclerotherapy procedure, please let the receptionist know that you are scheduling for the fifteen (15) minute sclerotherapy procedure not just a regular visit.  2. On the day of the procedure, please cleanse and dry the areas, but do not use any moisturizers or other products on the area(s) to be treated.  3. Bring a pair of comfortable shorts to wear during the procedure.  4. Be sure to bring your recommended graduated compression stockings with you to the office. You will be wearing them home when your visit is over. These compression hose can be purchased at most medical supply stores.  After Your Sclerotherapy Procedure  1. Please wear the graduated compression stockings for 24 hours immediately following the completion of the sclerotherapy procedure.  2. We recommend that you avoid vigorous activity as much as possible for the first twenty-four (24) hours. You can do your "normal" routine, but avoid an above normal amount of time on your feet. Elevating the legs when sitting and avoidance of vigorous leg movements or exercise in the first few days after treatment may improve your results.  3. You may remove the compression dressings (cotton balls) and tape the next morning.  4. Please continue wearing the compression stockings during waking hours for the two (2) weeks following sclerotherapy.  5. If you have any blisters, sores or ulcers or other problems following your procedure please call or return to the office immediately.     THE PROCEDURE FEE IS $350.00 PER FIFTEEN (15) MINUTE SESSION. WE REQUIRE THAT THIS PROCEDURE BE PAID FOR IN FULL ON OR BEFORE THE DATE THAT IT IS PERFORMED. WE WILL GIVE YOU A RECEIPT THAT YOU CAN FILE WITH YOUR INSURANCE COMPANY. WE GENERALLY DO NOT FILE THIS PROCEDURE WITH ANY INSURANCE COMPANY EXCEPT UNDER CERTAIN CIRCUMSTANCES WHERE PRIOR AUTHORIZATION HAS BEEN  CONFIRMED. THIS PROCEDURE IS GENERALLY CONSIDERED TO BE A COSMETIC PROCEDURE BY INSURANCE COMPANIES.    Due to recent changes in healthcare laws, you may see results of your pathology and/or laboratory studies on MyChart before the doctors have had a chance to review them. We understand that in some cases there may be results that are confusing or concerning to you. Please understand that not all results are received at the same time and often the doctors may need to interpret multiple results in order to provide you with the best plan of care or course of treatment. Therefore, we ask that you please give us 2 business days to thoroughly review all your results before contacting the office for clarification. Should we see a critical lab result, you will be contacted sooner.   If You Need Anything After Your Visit  If you have any questions or concerns for your doctor, please call our main line at [phone] and press option 4 to reach your doctor's medical assistant. If no one answers, please leave a voicemail as directed and we will return your call as soon as possible. Messages left after 4 pm will be answered the following business day.   You may also send us a message via MyChart. We typically respond to MyChart messages within 1-2 business days.  For prescription refills, please ask your pharmacy to contact our office. Our fax number is [phone].  If you have an urgent issue when the clinic is closed that cannot wait until the next business day, you can page your doctor at the number below.      Please note that while we do our best to be available for urgent issues outside of office hours, we are not available 24/7.   If you have an urgent issue and are unable to reach us, you may choose to seek medical care at your doctor's office, retail clinic, urgent care center, or emergency room.  If you have a medical emergency, please immediately call 911 or go to the emergency  department.  Pager Numbers  - Dr. Kowalski: [phone]  - Dr. Moye: [phone]  - Dr. Stewart: [phone]  In the event of inclement weather, please call our main line at [phone] for an update on the status of any delays or closures.  Dermatology Medication Tips: Please keep the boxes that topical medications come in in order to help keep track of the instructions about where and how to use these. Pharmacies typically print the medication instructions only on the boxes and not directly on the medication tubes.   If your medication is too expensive, please contact our office at [phone] option 4 or send us a message through MyChart.   We are unable to tell what your co-pay for medications will be in advance as this is different depending on your insurance coverage. However, we may be able to find a substitute medication at lower cost or fill out paperwork to get insurance to cover a needed medication.   If a prior authorization is required to get your medication covered by your insurance company, please allow us 1-2 business days to complete this process.  Drug prices often vary depending on where the prescription is filled and some pharmacies may offer cheaper prices.  The website www.goodrx.com contains coupons for medications through different pharmacies. The prices here do not account for what the cost may be with help from insurance (it may be cheaper with your insurance), but the website can give you the price if you did not use any insurance.  - You can print the associated coupon and take it with your prescription to the pharmacy.  - You may also stop by our office during regular business hours and pick up a GoodRx coupon card.  - If you need your prescription sent electronically to a different pharmacy, notify our office through [place] MyChart or by phone at [phone] option 4.     Si Usted Necesita Algo Despus de Su Visita  Tambin puede enviarnos un  mensaje a travs de MyChart. Por lo general respondemos a los mensajes de MyChart en el transcurso de 1 a 2 das hbiles.  Para renovar recetas, por favor pida a su farmacia que se ponga en contacto con nuestra oficina. Nuestro nmero de fax es el [phone].  Si tiene un asunto urgente cuando la clnica est cerrada y que no puede esperar hasta el siguiente da hbil, puede llamar/localizar a su doctor(a) al nmero que aparece a continuacin.   Por favor, tenga en cuenta que aunque hacemos todo lo posible para estar disponibles para asuntos urgentes fuera del horario de oficina, no estamos disponibles las 24 horas del da, los 7 das de la semana.   Si tiene un problema urgente y no puede comunicarse con nosotros, puede optar por buscar atencin mdica  en el consultorio de su doctor(a), en una clnica privada, en un centro de atencin urgente o en una sala de emergencias.  Si tiene una emergencia mdica, por favor llame inmediatamente al 911 o vaya a la sala de emergencias.  Nmeros de bper  - Dr. Kowalski:   [phone]  - Dra. Moye: [phone]  - Dra. Stewart: [phone]  En caso de inclemencias del tiempo, por favor llame a nuestra lnea principal al [phone] para una actualizacin sobre el estado de cualquier retraso o cierre.  Consejos para la medicacin en dermatologa: Por favor, guarde las cajas en las que vienen los medicamentos de uso tpico para ayudarle a seguir las instrucciones sobre dnde y cmo usarlos. Las farmacias generalmente imprimen las instrucciones del medicamento slo en las cajas y no directamente en los tubos del medicamento.   Si su medicamento es muy caro, por favor, pngase en contacto con nuestra oficina llamando al [phone] y presione la opcin 4 o envenos un mensaje a travs de MyChart.   No podemos decirle cul ser su copago por los medicamentos por adelantado ya que esto es diferente dependiendo de la cobertura de su seguro. Sin embargo,  es posible que podamos encontrar un medicamento sustituto a menor costo o llenar un formulario para que el seguro cubra el medicamento que se considera necesario.   Si se requiere una autorizacin previa para que su compaa de seguros cubra su medicamento, por favor permtanos de 1 a 2 das hbiles para completar este proceso.  Los precios de los medicamentos varan con frecuencia dependiendo del lugar de dnde se surte la receta y alguna farmacias pueden ofrecer precios ms baratos.  El sitio web www.goodrx.com tiene cupones para medicamentos de diferentes farmacias. Los precios aqu no tienen en cuenta lo que podra costar con la ayuda del seguro (puede ser ms barato con su seguro), pero el sitio web puede darle el precio si no utiliz ningn seguro.  - Puede imprimir el cupn correspondiente y llevarlo con su receta a la farmacia.  - Tambin puede pasar por nuestra oficina durante el horario de atencin regular y recoger una tarjeta de cupones de GoodRx.  - Si necesita que su receta se enve electrnicamente a una farmacia diferente, informe a nuestra oficina a travs de MyChart de [place] o por telfono llamando al [phone] y presione la opcin 4.  

## 2022-12-19 ENCOUNTER — Ambulatory Visit
Admission: RE | Admit: 2022-12-19 | Discharge: 2022-12-19 | Disposition: A | Discharge status: Home / Self Care | Payer: Medicare HMO | Source: Ambulatory Visit | Attending: Emergency Medicine | Admitting: Emergency Medicine

## 2022-12-19 VITALS — BP 133/83 | HR 57 | Temp 98.1°F | Resp 18 | Ht 64.0 in | Wt 196.0 lb

## 2022-12-19 DIAGNOSIS — R300 Dysuria: Secondary | ICD-10-CM | POA: Insufficient documentation

## 2022-12-19 LAB — POCT URINALYSIS DIP (MANUAL ENTRY)
Bilirubin, UA: NEGATIVE
Glucose, UA: NEGATIVE mg/dL
Ketones, POC UA: NEGATIVE mg/dL
Nitrite, UA: NEGATIVE
Protein Ur, POC: 100 mg/dL — AB
Spec Grav, UA: >=1.03 — AB (ref 1.010–1.025)
Urobilinogen, UA: 2 E.U./dL — AB
pH, UA: 6 (ref 5.0–8.0)

## 2022-12-19 MED ORDER — CEPHALEXIN 500 MG PO CAPS: 500.0000 mg | ORAL_CAPSULE | Freq: Two times a day (BID) | ORAL | 0 refills | Status: AC

## 2022-12-19 NOTE — ED Triage Notes (Signed)
Patient to Urgent Care with complaints of urinary frequency/ urgency/ dysuria. Symptoms started yesterday afternoon.   Denies any vaginal discharge or fevers.

## 2022-12-19 NOTE — Discharge Instructions (Addendum)
Take the antibiotic as directed.  The urine culture is pending.  We will call you if it shows the need to change or discontinue your antibiotic.   ° °Follow up with your primary care provider if your symptoms are not improving.   ° ° °

## 2022-12-19 NOTE — ED Provider Notes (Signed)
Pamela Jacobs    CSN: [id number] Arrival date & time: 12/19/22  1851      History   Chief Complaint Chief Complaint  Patient presents with   Urinary Frequency    Possible ITI-going a lot and not feeling relief once I go - Entered by patient    HPI Pamela Jacobs is a 67 y.o. female.  Patient presents with dysuria, urinary frequency, urinary urgency x 1 day.  No fever, abdominal pain, hematuria, nausea, vomiting, diarrhea, vaginal discharge, pelvic pain, or other symptoms.  No OTC medications taken.  Her medical history includes GERD, arthritis, skin cancer.   The history is provided by the patient and medical records.    Past Medical History:  Diagnosis Date   Arthritis    GERD (gastroesophageal reflux disease)    Skin cancer     Patient Active Problem List   Diagnosis Date Noted   Special screening for malignant neoplasms, colon    Increased BMI 01/12/2016   Menopause 01/12/2016   Urinary urgency 08/10/2015   Nocturia 08/10/2015    Past Surgical History:  Procedure Laterality Date   BREAST EXCISIONAL BIOPSY Left 2000   benign   CESAREAN SECTION  6948,5462   COLONOSCOPY WITH PROPOFOL N/A 07/04/2017   Procedure: COLONOSCOPY WITH PROPOFOL;  Surgeon: Lucilla Lame, MD;  Location: [facility];  Service: Endoscopy;  Laterality: N/A;   DILATION AND CURETTAGE OF UTERUS     FOOT SURGERY  2012   plate placed   FOOT SURGERY Left    October 2023   NASAL SEPTUM SURGERY  1975   TUBAL LIGATION      OB History     Gravida  3   Para  2   Term  2   Preterm      AB  1   Living  2      SAB  1   IAB      Ectopic      Multiple      Live Births  2            Home Medications    Prior to Admission medications   Medication Sig Start Date End Date Taking? Authorizing Provider  cephALEXin (KEFLEX) 500 MG capsule Take 1 capsule (500 mg total) by mouth 2 (two) times daily for 5 days. 12/19/22 12/24/22 Yes Sharion Balloon, NP  benzonatate  (TESSALON) 200 MG capsule Take 1 capsule (200 mg total) by mouth 3 (three) times daily as needed for cough. 11/29/22   Melynda Ripple, NP  Calcium Carb-Cholecalciferol 600-10 MG-MCG TABS Take by mouth. 07/15/22   [provider]  estradiol-norethindrone (ACTIVELLA) 1-0.5 MG tablet TAKE 1 TABLET BY MOUTH DAILY 04/28/22 04/23/23  Harlin Heys, MD  fluconazole (DIFLUCAN) 150 MG tablet Take 1 tablet (150 mg total) by mouth once as needed for up to 1 dose (yeast infection). 09/14/22   McDonald, Stephan Minister, DPM  gabapentin (NEURONTIN) 300 MG capsule Take 1 capsule (300 mg total) by mouth 3 (three) times daily for 7 days. 09/09/22 09/16/22  McDonald, Stephan Minister, DPM  losartan (COZAAR) 25 MG tablet TAKE 1 TABLET(25 MG) BY MOUTH AT BEDTIME 07/15/22   [provider]  mupirocin ointment (BACTROBAN) 2 % Apply 1 Application topically daily. 11/03/22   Criselda Peaches, DPM  omeprazole (PRILOSEC) 40 MG capsule TK 1 C PO D 05/23/19   [provider]  rivaroxaban (XARELTO) 10 MG TABS tablet Take 1 tablet (10 mg total) by  mouth daily. 09/09/22 10/09/22  Criselda Peaches, DPM  Vitamin D, Ergocalciferol, (DRISDOL) 50000 UNITS CAPS capsule TAKE 1 CAPSULE BY MOUTH WEEKLY 06/10/15   [provider]    Family History Family History  Problem Relation Age of Onset   Hypertension Father    Bladder Cancer Neg Hx    Kidney cancer Neg Hx    Prostate cancer Neg Hx    Cancer Neg Hx    Diabetes Neg Hx    Heart disease Neg Hx    Ovarian cancer Neg Hx    Breast cancer Neg Hx     Social History Social History   Tobacco Use   Smoking status: Never   Smokeless tobacco: Never  Vaping Use   Vaping Use: Never used  Substance Use Topics   Alcohol use: Yes    Alcohol/week: 0.0 standard drinks of alcohol    Comment: occasionaly   Drug use: No     Allergies   Patient has no known allergies.   Review of Systems Review of Systems  Constitutional:  Negative for chills and fever.   Gastrointestinal:  Negative for abdominal pain, diarrhea, nausea and vomiting.  Genitourinary:  Positive for dysuria, frequency and urgency. Negative for flank pain, hematuria, pelvic pain and vaginal discharge.  Skin:  Negative for rash.  All other systems reviewed and are negative.    Physical Exam Triage Vital Signs ED Triage Vitals  Enc Vitals Group     BP --      Pulse --      Resp --      Temp --      Temp src --      SpO2 --      Weight 12/19/22 1908 196 lb (88.9 kg)     Height 12/19/22 1908 '5\' 4"'$  (1.626 m)     Head Circumference --      Peak Flow --      Pain Score 12/19/22 1907 0     Pain Loc --      Pain Edu? --      Excl. in [place]? --    No data found.  Updated Vital Signs BP 133/83   Pulse (!) 57   Temp 98.1 F (36.7 C)   Resp 18   Ht '5\' 4"'$  (1.626 m)   Wt 196 lb (88.9 kg)   SpO2 98%   BMI 33.64 kg/m   Visual Acuity Right Eye Distance:   Left Eye Distance:   Bilateral Distance:    Right Eye Near:   Left Eye Near:    Bilateral Near:     Physical Exam Vitals and nursing note reviewed.  Constitutional:      General: She is not in acute distress.    Appearance: Normal appearance. She is well-developed. She is not ill-appearing.  HENT:     Mouth/Throat:     Mouth: Mucous membranes are moist.  Cardiovascular:     Rate and Rhythm: Normal rate and regular rhythm.     Heart sounds: Normal heart sounds.  Pulmonary:     Effort: Pulmonary effort is normal. No respiratory distress.     Breath sounds: Normal breath sounds.  Abdominal:     General: Bowel sounds are normal.     Palpations: Abdomen is soft.     Tenderness: There is no abdominal tenderness. There is no right CVA tenderness, left CVA tenderness, guarding or rebound.  Musculoskeletal:     Cervical back: Neck supple.  Skin:  General: Skin is warm and dry.  Neurological:     Mental Status: She is alert.  Psychiatric:        Mood and Affect: Mood normal.        Behavior: Behavior  normal.      UC Treatments / Results  Labs (all labs ordered are listed, but only abnormal results are displayed) Labs Reviewed  POCT URINALYSIS DIP (MANUAL ENTRY) - Abnormal; Notable for the following components:      Result Value   Color, UA straw (*)    Spec Grav, UA >=1.030 (*)    Blood, UA large (*)    Protein Ur, POC =100 (*)    Urobilinogen, UA 2.0 (*)    Leukocytes, UA Small (1+) (*)    All other components within normal limits  URINE CULTURE    EKG   Radiology No results found.  Procedures Procedures (including critical care time)  Medications Ordered in UC Medications - No data to display  Initial Impression / Assessment and Plan / UC Course  I have reviewed the triage vital signs and the nursing notes.  Pertinent labs & imaging results that were available during my care of the patient were reviewed by me and considered in my medical decision making (see chart for details).    Dysuria.  Treating with Keflex. Urine culture pending. Discussed with patient that we will call her if the urine culture shows the need to change or discontinue the antibiotic. Instructed her to follow-up with her PCP if her symptoms are not improving. Patient agrees to plan of care.     Final Clinical Impressions(s) / UC Diagnoses   Final diagnoses:  Dysuria     Discharge Instructions      Take the antibiotic as directed.  The urine culture is pending.  We will call you if it shows the need to change or discontinue your antibiotic.    Follow up with your primary care provider if your symptoms are not improving.        ED Prescriptions     Medication Sig Dispense Auth. Provider   cephALEXin (KEFLEX) 500 MG capsule Take 1 capsule (500 mg total) by mouth 2 (two) times daily for 5 days. 10 capsule Sharion Balloon, NP      PDMP not reviewed this encounter.   Sharion Balloon, NP 12/19/22 1935

## 2022-12-22 ENCOUNTER — Encounter: Payer: Self-pay | Admitting: Dermatology

## 2022-12-22 LAB — URINE CULTURE: Culture: >=100000 — AB

## 2022-12-22 NOTE — Progress Notes (Signed)
   Follow-Up Visit   Subjective  Pamela Jacobs is a 67 y.o. female who presents for the following: Other (Spider veins of legs. She has had sclero in the past and would like additional treatments.).  The following portions of the chart were reviewed this encounter and updated as appropriate:   Tobacco  Allergies  Meds  Problems  Med Hx  Surg Hx  Fam Hx     Review of Systems:  No other skin or systemic complaints except as noted in HPI or Assessment and Plan.  Objective  Well appearing patient in no apparent distress; mood and affect are within normal limits.  A focused examination was performed including legs. Relevant physical exam findings are noted in the Assessment and Plan.  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  Return for Follow up as scheduled.  I, Ashok Cordia, CMA, am acting as scribe for Sarina Ser, MD . Documentation: I have reviewed the above documentation for accuracy and completeness, and I agree with the above.  Sarina Ser, MD

## 2022-12-23 DIAGNOSIS — J069 Acute upper respiratory infection, unspecified: Secondary | ICD-10-CM | POA: Diagnosis not present

## 2022-12-30 DIAGNOSIS — M25561 Pain in right knee: Secondary | ICD-10-CM | POA: Diagnosis not present

## 2022-12-30 DIAGNOSIS — M25661 Stiffness of right knee, not elsewhere classified: Secondary | ICD-10-CM | POA: Diagnosis not present

## 2022-12-30 DIAGNOSIS — M6281 Muscle weakness (generalized): Secondary | ICD-10-CM | POA: Diagnosis not present

## 2022-12-30 DIAGNOSIS — G8929 Other chronic pain: Secondary | ICD-10-CM | POA: Diagnosis not present

## 2022-12-30 DIAGNOSIS — M1711 Unilateral primary osteoarthritis, right knee: Secondary | ICD-10-CM | POA: Diagnosis not present

## 2023-01-04 DIAGNOSIS — C44319 Basal cell carcinoma of skin of other parts of face: Secondary | ICD-10-CM | POA: Diagnosis not present

## 2023-01-06 DIAGNOSIS — G8929 Other chronic pain: Secondary | ICD-10-CM | POA: Diagnosis not present

## 2023-01-06 DIAGNOSIS — M25561 Pain in right knee: Secondary | ICD-10-CM | POA: Diagnosis not present

## 2023-01-06 DIAGNOSIS — M25661 Stiffness of right knee, not elsewhere classified: Secondary | ICD-10-CM | POA: Diagnosis not present

## 2023-01-06 DIAGNOSIS — M1711 Unilateral primary osteoarthritis, right knee: Secondary | ICD-10-CM | POA: Diagnosis not present

## 2023-01-06 DIAGNOSIS — M6281 Muscle weakness (generalized): Secondary | ICD-10-CM | POA: Diagnosis not present

## 2023-01-11 DIAGNOSIS — C44619 Basal cell carcinoma of skin of left upper limb, including shoulder: Secondary | ICD-10-CM | POA: Diagnosis not present

## 2023-01-12 ENCOUNTER — Ambulatory Visit: Payer: 59 | Admitting: Dermatology

## 2023-01-13 DIAGNOSIS — M1711 Unilateral primary osteoarthritis, right knee: Secondary | ICD-10-CM | POA: Diagnosis not present

## 2023-01-13 DIAGNOSIS — M25561 Pain in right knee: Secondary | ICD-10-CM | POA: Diagnosis not present

## 2023-01-13 DIAGNOSIS — M25661 Stiffness of right knee, not elsewhere classified: Secondary | ICD-10-CM | POA: Diagnosis not present

## 2023-01-13 DIAGNOSIS — M6281 Muscle weakness (generalized): Secondary | ICD-10-CM | POA: Diagnosis not present

## 2023-01-13 DIAGNOSIS — G8929 Other chronic pain: Secondary | ICD-10-CM | POA: Diagnosis not present

## 2023-01-20 DIAGNOSIS — Z0100 Encounter for examination of eyes and vision without abnormal findings: Secondary | ICD-10-CM | POA: Diagnosis not present

## 2023-01-20 DIAGNOSIS — M6281 Muscle weakness (generalized): Secondary | ICD-10-CM | POA: Diagnosis not present

## 2023-01-20 DIAGNOSIS — M1711 Unilateral primary osteoarthritis, right knee: Secondary | ICD-10-CM | POA: Diagnosis not present

## 2023-01-20 DIAGNOSIS — M25561 Pain in right knee: Secondary | ICD-10-CM | POA: Diagnosis not present

## 2023-01-20 DIAGNOSIS — G8929 Other chronic pain: Secondary | ICD-10-CM | POA: Diagnosis not present

## 2023-01-20 DIAGNOSIS — M25661 Stiffness of right knee, not elsewhere classified: Secondary | ICD-10-CM | POA: Diagnosis not present

## 2023-01-24 DIAGNOSIS — M25561 Pain in right knee: Secondary | ICD-10-CM | POA: Diagnosis not present

## 2023-01-24 DIAGNOSIS — M1711 Unilateral primary osteoarthritis, right knee: Secondary | ICD-10-CM | POA: Diagnosis not present

## 2023-01-24 DIAGNOSIS — M6281 Muscle weakness (generalized): Secondary | ICD-10-CM | POA: Diagnosis not present

## 2023-01-24 DIAGNOSIS — M25661 Stiffness of right knee, not elsewhere classified: Secondary | ICD-10-CM | POA: Diagnosis not present

## 2023-01-24 DIAGNOSIS — G8929 Other chronic pain: Secondary | ICD-10-CM | POA: Diagnosis not present

## 2023-01-26 ENCOUNTER — Ambulatory Visit (INDEPENDENT_AMBULATORY_CARE_PROVIDER_SITE_OTHER): Payer: Self-pay | Admitting: Dermatology

## 2023-01-26 DIAGNOSIS — I8393 Asymptomatic varicose veins of bilateral lower extremities: Secondary | ICD-10-CM

## 2023-01-26 NOTE — Progress Notes (Signed)
   Follow-Up Visit   Subjective  Pamela Jacobs is a 67 y.o. female who presents for the following: Varicose Veins (Patient here for sclerotherapy on her legs today ).  The following portions of the chart were reviewed this encounter and updated as appropriate:   Tobacco  Allergies  Meds  Problems  Med Hx  Surg Hx  Fam Hx     Review of Systems:  No other skin or systemic complaints except as noted in HPI or Assessment and Plan.  Objective  Well appearing patient in no apparent distress; mood and affect are within normal limits.  A focused examination was performed including bilateral lower legs. Relevant physical exam findings are noted in the Assessment and Plan.  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 Larger vessels may be covered if symptomatic and we would refer to vascular surgeon if treatment desired.   Sclerotherapy to the right lower leg only today we will treat left lower leg at next visit   Intralesional injection - right lower leg, left lower leg The patient presents for desired sclerotherapy for desired treatment of desired treatment of small to medium blue varicosities of the Right lower leg  Procedure: The patient was counseled and understands about the effects, side effects and potential risks and complications of the sclerotherapy procedure. The patient was given the opportunity to ask questions. Asclera (polidocanol) 1% (total 2cc) was injected into the varices. In order to ensure correct placement of the catheter in the vein, I drew back slightly to give moderate blood show in  the syringe. If there was any evidence or suspicion of extravasation of sclerosant, the area was immediately diluted with a large volume of 0.9% saline. A pressure dressing was applied immediately to the injected sites. The patient tolerated the procedure well without complication. The patient was instructed in post-operative compression stocking use. The patient understands to call or return immediately if any problems noted.    Return if symptoms worsen or fail to improve.  IAngelique Holm, CMA, am acting as scribe for Armida Sans, MD .  Documentation: I have reviewed the above documentation for accuracy and completeness, and I agree with the above.  Armida Sans, MD

## 2023-01-26 NOTE — Patient Instructions (Signed)
Due to recent changes in healthcare laws, you may see results of your pathology and/or laboratory studies on MyChart before the doctors have had a chance to review them. We understand that in some cases there may be results that are confusing or concerning to you. Please understand that not all results are received at the same time and often the doctors may need to interpret multiple results in order to provide you with the best plan of care or course of treatment. Therefore, we ask that you please give us 2 business days to thoroughly review all your results before contacting the office for clarification. Should we see a critical lab result, you will be contacted sooner.   If You Need Anything After Your Visit  If you have any questions or concerns for your doctor, please call our main line at [phone] and press option 4 to reach your doctor's medical assistant. If no one answers, please leave a voicemail as directed and we will return your call as soon as possible. Messages left after 4 pm will be answered the following business day.   You may also send us a message via MyChart. We typically respond to MyChart messages within 1-2 business days.  For prescription refills, please ask your pharmacy to contact our office. Our fax number is [phone].  If you have an urgent issue when the clinic is closed that cannot wait until the next business day, you can page your doctor at the number below.    Please note that while we do our best to be available for urgent issues outside of office hours, we are not available 24/7.   If you have an urgent issue and are unable to reach us, you may choose to seek medical care at your doctor's office, retail clinic, urgent care center, or emergency room.  If you have a medical emergency, please immediately call 911 or go to the emergency department.  Pager Numbers  - Dr. Kowalski: [phone]  - Dr. Moye: [phone]  - Dr. Stewart:  [phone]  In the event of inclement weather, please call our main line at [phone] for an update on the status of any delays or closures.  Dermatology Medication Tips: Please keep the boxes that topical medications come in in order to help keep track of the instructions about where and how to use these. Pharmacies typically print the medication instructions only on the boxes and not directly on the medication tubes.   If your medication is too expensive, please contact our office at [phone] option 4 or send us a message through MyChart.   We are unable to tell what your co-pay for medications will be in advance as this is different depending on your insurance coverage. However, we may be able to find a substitute medication at lower cost or fill out paperwork to get insurance to cover a needed medication.   If a prior authorization is required to get your medication covered by your insurance company, please allow us 1-2 business days to complete this process.  Drug prices often vary depending on where the prescription is filled and some pharmacies may offer cheaper prices.  The website www.goodrx.com contains coupons for medications through different pharmacies. The prices here do not account for what the cost may be with help from insurance (it may be cheaper with your insurance), but the website can give you the price if you did not use any insurance.  - You can print the associated coupon and take it with   your prescription to the pharmacy.  - You may also stop by our office during regular business hours and pick up a GoodRx coupon card.  - If you need your prescription sent electronically to a different pharmacy, notify our office through [place] MyChart or by phone at [phone] option 4.     Si Usted Necesita Algo Despus de Su Visita  Tambin puede enviarnos un mensaje a travs de MyChart. Por lo general respondemos a los mensajes de MyChart en el transcurso de 1 a 2  das hbiles.  Para renovar recetas, por favor pida a su farmacia que se ponga en contacto con nuestra oficina. Nuestro nmero de fax es el [phone].  Si tiene un asunto urgente cuando la clnica est cerrada y que no puede esperar hasta el siguiente da hbil, puede llamar/localizar a su doctor(a) al nmero que aparece a continuacin.   Por favor, tenga en cuenta que aunque hacemos todo lo posible para estar disponibles para asuntos urgentes fuera del horario de oficina, no estamos disponibles las 24 horas del da, los 7 das de la semana.   Si tiene un problema urgente y no puede comunicarse con nosotros, puede optar por buscar atencin mdica  en el consultorio de su doctor(a), en una clnica privada, en un centro de atencin urgente o en una sala de emergencias.  Si tiene una emergencia mdica, por favor llame inmediatamente al 911 o vaya a la sala de emergencias.  Nmeros de bper  - Dr. Kowalski: [phone]  - Dra. Moye: [phone]  - Dra. Stewart: [phone]  En caso de inclemencias del tiempo, por favor llame a nuestra lnea principal al [phone] para una actualizacin sobre el estado de cualquier retraso o cierre.  Consejos para la medicacin en dermatologa: Por favor, guarde las cajas en las que vienen los medicamentos de uso tpico para ayudarle a seguir las instrucciones sobre dnde y cmo usarlos. Las farmacias generalmente imprimen las instrucciones del medicamento slo en las cajas y no directamente en los tubos del medicamento.   Si su medicamento es muy caro, por favor, pngase en contacto con nuestra oficina llamando al [phone] y presione la opcin 4 o envenos un mensaje a travs de MyChart.   No podemos decirle cul ser su copago por los medicamentos por adelantado ya que esto es diferente dependiendo de la cobertura de su seguro. Sin embargo, es posible que podamos encontrar un medicamento sustituto a menor costo o llenar un formulario para que el  seguro cubra el medicamento que se considera necesario.   Si se requiere una autorizacin previa para que su compaa de seguros cubra su medicamento, por favor permtanos de 1 a 2 das hbiles para completar este proceso.  Los precios de los medicamentos varan con frecuencia dependiendo del lugar de dnde se surte la receta y alguna farmacias pueden ofrecer precios ms baratos.  El sitio web www.goodrx.com tiene cupones para medicamentos de diferentes farmacias. Los precios aqu no tienen en cuenta lo que podra costar con la ayuda del seguro (puede ser ms barato con su seguro), pero el sitio web puede darle el precio si no utiliz ningn seguro.  - Puede imprimir el cupn correspondiente y llevarlo con su receta a la farmacia.  - Tambin puede pasar por nuestra oficina durante el horario de atencin regular y recoger una tarjeta de cupones de GoodRx.  - Si necesita que su receta se enve electrnicamente a una farmacia diferente, informe a nuestra oficina a travs de MyChart de [facility]   o por telfono llamando al [phone] y presione la opcin 4.  

## 2023-01-30 ENCOUNTER — Encounter: Payer: Self-pay | Admitting: Podiatry

## 2023-01-30 ENCOUNTER — Ambulatory Visit: Payer: Medicare HMO | Admitting: Podiatry

## 2023-01-30 VITALS — BP 143/77 | HR 74

## 2023-01-30 DIAGNOSIS — L600 Ingrowing nail: Secondary | ICD-10-CM | POA: Diagnosis not present

## 2023-01-30 DIAGNOSIS — B351 Tinea unguium: Secondary | ICD-10-CM

## 2023-01-30 DIAGNOSIS — L603 Nail dystrophy: Secondary | ICD-10-CM | POA: Diagnosis not present

## 2023-01-30 MED ORDER — NEOMYCIN-POLYMYXIN-HC 3.5-10000-1 OT SUSP: OTIC | 0 refills | Status: DC

## 2023-01-30 NOTE — Patient Instructions (Signed)

## 2023-01-31 NOTE — Progress Notes (Signed)
  Subjective:  Patient ID: ABREANA EIB, female    DOB: 26-Aug-1956,  MRN: [id number]  Chief Complaint  Patient presents with   Nail Problem    "My toenail is starting to hurt again."     67 y.o. female returns for post-op check.  Foot is doing okay, the left ingrown nail has returned and is thickened discolored and causing pain  Review of Systems: Negative except as noted in the HPI. Denies N/V/F/Ch.   Objective:   Vitals:   01/30/23 1538  BP: (!) 143/77  Pulse: 74   There is no height or weight on file to calculate BMI. Constitutional Well developed. Well nourished.  Vascular Foot warm and well perfused. Capillary refill normal to all digits.  Calf is soft and supple, no posterior calf or knee pain, negative Homans' sign  Neurologic Normal speech. Oriented to person, place, and time. Epicritic sensation to light touch grossly present bilaterally.  Dermatologic Incision on foot well-healed, she has an ingrown nail with paronychia and discoloration and onychomycosis in the medial fold  Orthopedic: Minimal edema in foot, she has pain and tenderness in the medial hallux   Multiple view plain film radiographs: Complete fusion across arthrodesis sites noted no complication of hardware or alignment Assessment:   1. Onychomycosis   2. Ingrowing left great toenail    Plan:  Patient was evaluated and treated and all questions answered.    Ingrown Nail, left -Patient elects to proceed with minor surgery to remove ingrown toenail today. Consent reviewed and signed by patient. -Ingrown nail excised. See procedure note. -Educated on post-procedure care including soaking. Written instructions provided and reviewed. -Rx for Cortisporin sent to pharmacy. -Advised on signs and symptoms of infection developing.  We discussed that the phenol likely will create some redness and edema and tenderness around the nailbed as long as it is localized this is to be expected.  Will return as  needed if any infection signs develop -Ingrown nail sent to [facility] pathology for mycotic analysis.  I will let her know what the results that show and we will plan for treatment of this -I will see her back as scheduled in April for follow-up on her left foot surgery and we will take x-rays of the foot and ankle  Procedure: Excision of Ingrown Toenail Location: Left 1st toe medial nail borders. Anesthesia: Lidocaine 1% plain; 1.5 mL and Marcaine 0.5% plain; 1.5 mL, digital block. Skin Prep: Betadine. Dressing: Silvadene; telfa; dry, sterile, compression dressing. Technique: Following skin prep, the toe was exsanguinated and a tourniquet was secured at the base of the toe. The affected nail border was freed, split with a nail splitter, and excised. Chemical matrixectomy was then performed with phenol and irrigated out with alcohol. The tourniquet was then removed and sterile dressing applied. Disposition: Patient tolerated procedure well.    No follow-ups on file.

## 2023-02-01 ENCOUNTER — Encounter: Payer: Self-pay | Admitting: Dermatology

## 2023-02-02 ENCOUNTER — Ambulatory Visit: Payer: Medicare HMO | Admitting: Dermatology

## 2023-02-02 ENCOUNTER — Encounter: Payer: Self-pay | Admitting: Dermatology

## 2023-02-02 VITALS — BP 129/74 | HR 73

## 2023-02-02 DIAGNOSIS — L578 Other skin changes due to chronic exposure to nonionizing radiation: Secondary | ICD-10-CM

## 2023-02-02 DIAGNOSIS — L814 Other melanin hyperpigmentation: Secondary | ICD-10-CM | POA: Diagnosis not present

## 2023-02-02 DIAGNOSIS — L821 Other seborrheic keratosis: Secondary | ICD-10-CM

## 2023-02-02 NOTE — Patient Instructions (Signed)
Due to recent changes in healthcare laws, you may see results of your pathology and/or laboratory studies on MyChart before the doctors have had a chance to review them. We understand that in some cases there may be results that are confusing or concerning to you. Please understand that not all results are received at the same time and often the doctors may need to interpret multiple results in order to provide you with the best plan of care or course of treatment. Therefore, we ask that you please give us 2 business days to thoroughly review all your results before contacting the office for clarification. Should we see a critical lab result, you will be contacted sooner.   If You Need Anything After Your Visit  If you have any questions or concerns for your doctor, please call our main line at [phone] and press option 4 to reach your doctor's medical assistant. If no one answers, please leave a voicemail as directed and we will return your call as soon as possible. Messages left after 4 pm will be answered the following business day.   You may also send us a message via MyChart. We typically respond to MyChart messages within 1-2 business days.  For prescription refills, please ask your pharmacy to contact our office. Our fax number is [phone].  If you have an urgent issue when the clinic is closed that cannot wait until the next business day, you can page your doctor at the number below.    Please note that while we do our best to be available for urgent issues outside of office hours, we are not available 24/7.   If you have an urgent issue and are unable to reach us, you may choose to seek medical care at your doctor's office, retail clinic, urgent care center, or emergency room.  If you have a medical emergency, please immediately call 911 or go to the emergency department.  Pager Numbers  - Dr. Kowalski: [phone]  - Dr. Moye: [phone]  - Dr. Stewart:  [phone]  In the event of inclement weather, please call our main line at [phone] for an update on the status of any delays or closures.  Dermatology Medication Tips: Please keep the boxes that topical medications come in in order to help keep track of the instructions about where and how to use these. Pharmacies typically print the medication instructions only on the boxes and not directly on the medication tubes.   If your medication is too expensive, please contact our office at [phone] option 4 or send us a message through MyChart.   We are unable to tell what your co-pay for medications will be in advance as this is different depending on your insurance coverage. However, we may be able to find a substitute medication at lower cost or fill out paperwork to get insurance to cover a needed medication.   If a prior authorization is required to get your medication covered by your insurance company, please allow us 1-2 business days to complete this process.  Drug prices often vary depending on where the prescription is filled and some pharmacies may offer cheaper prices.  The website www.goodrx.com contains coupons for medications through different pharmacies. The prices here do not account for what the cost may be with help from insurance (it may be cheaper with your insurance), but the website can give you the price if you did not use any insurance.  - You can print the associated coupon and take it with   your prescription to the pharmacy.  - You may also stop by our office during regular business hours and pick up a GoodRx coupon card.  - If you need your prescription sent electronically to a different pharmacy, notify our office through [place] MyChart or by phone at [phone] option 4.     Si Usted Necesita Algo Despus de Su Visita  Tambin puede enviarnos un mensaje a travs de MyChart. Por lo general respondemos a los mensajes de MyChart en el transcurso de 1 a 2  das hbiles.  Para renovar recetas, por favor pida a su farmacia que se ponga en contacto con nuestra oficina. Nuestro nmero de fax es el [phone].  Si tiene un asunto urgente cuando la clnica est cerrada y que no puede esperar hasta el siguiente da hbil, puede llamar/localizar a su doctor(a) al nmero que aparece a continuacin.   Por favor, tenga en cuenta que aunque hacemos todo lo posible para estar disponibles para asuntos urgentes fuera del horario de oficina, no estamos disponibles las 24 horas del da, los 7 das de la semana.   Si tiene un problema urgente y no puede comunicarse con nosotros, puede optar por buscar atencin mdica  en el consultorio de su doctor(a), en una clnica privada, en un centro de atencin urgente o en una sala de emergencias.  Si tiene una emergencia mdica, por favor llame inmediatamente al 911 o vaya a la sala de emergencias.  Nmeros de bper  - Dr. Kowalski: [phone]  - Dra. Moye: [phone]  - Dra. Stewart: [phone]  En caso de inclemencias del tiempo, por favor llame a nuestra lnea principal al [phone] para una actualizacin sobre el estado de cualquier retraso o cierre.  Consejos para la medicacin en dermatologa: Por favor, guarde las cajas en las que vienen los medicamentos de uso tpico para ayudarle a seguir las instrucciones sobre dnde y cmo usarlos. Las farmacias generalmente imprimen las instrucciones del medicamento slo en las cajas y no directamente en los tubos del medicamento.   Si su medicamento es muy caro, por favor, pngase en contacto con nuestra oficina llamando al [phone] y presione la opcin 4 o envenos un mensaje a travs de MyChart.   No podemos decirle cul ser su copago por los medicamentos por adelantado ya que esto es diferente dependiendo de la cobertura de su seguro. Sin embargo, es posible que podamos encontrar un medicamento sustituto a menor costo o llenar un formulario para que el  seguro cubra el medicamento que se considera necesario.   Si se requiere una autorizacin previa para que su compaa de seguros cubra su medicamento, por favor permtanos de 1 a 2 das hbiles para completar este proceso.  Los precios de los medicamentos varan con frecuencia dependiendo del lugar de dnde se surte la receta y alguna farmacias pueden ofrecer precios ms baratos.  El sitio web www.goodrx.com tiene cupones para medicamentos de diferentes farmacias. Los precios aqu no tienen en cuenta lo que podra costar con la ayuda del seguro (puede ser ms barato con su seguro), pero el sitio web puede darle el precio si no utiliz ningn seguro.  - Puede imprimir el cupn correspondiente y llevarlo con su receta a la farmacia.  - Tambin puede pasar por nuestra oficina durante el horario de atencin regular y recoger una tarjeta de cupones de GoodRx.  - Si necesita que su receta se enve electrnicamente a una farmacia diferente, informe a nuestra oficina a travs de MyChart de [facility]   o por telfono llamando al [phone] y presione la opcin 4.  

## 2023-02-02 NOTE — Progress Notes (Signed)
   Follow-Up Visit   Subjective  Pamela Jacobs is a 67 y.o. female who presents for the following: Skin Problem (The patient has spots, moles and lesions to be evaluated, some may be new or changing and the patient has concerns that these could be cancer. ).  The following portions of the chart were reviewed this encounter and updated as appropriate:   Tobacco  Allergies  Meds  Problems  Med Hx  Surg Hx  Fam Hx     Review of Systems:  No other skin or systemic complaints except as noted in HPI or Assessment and Plan.  Objective  Well appearing patient in no apparent distress; mood and affect are within normal limits.  A focused examination was performed including face,arms. Relevant physical exam findings are noted in the Assessment and Plan.  face Stuck-on, waxy, tan-brown papules and plaques -- Discussed benign etiology and prognosis.    Assessment & Plan  Seborrheic keratosis face - Benign-appearing - Discussed benign etiology and prognosis. - Observe - Call for any changes  Actinic Damage - chronic, secondary to cumulative UV radiation exposure/sun exposure over time - diffuse scaly erythematous macules with underlying dyspigmentation - Recommend daily broad spectrum sunscreen SPF 30+ to sun-exposed areas, reapply every 2 hours as needed.  - Recommend staying in the shade or wearing long sleeves, sun glasses (UVA+UVB protection) and wide brim hats (4-inch brim around the entire circumference of the hat). - Call for new or changing lesions.   Lentigines - Scattered tan macules - Due to sun exposure - Benign-appearing, observe - Recommend daily broad spectrum sunscreen SPF 30+ to sun-exposed areas, reapply every 2 hours as needed. - Call for any changes   Return in about 6 months (around 08/05/2023) for TBSE, hx of SCC, hx of BCC.  IMarye Round, CMA, am acting as scribe for Sarina Ser, MD .  Documentation: I have reviewed the above documentation for  accuracy and completeness, and I agree with the above.  Sarina Ser, MD

## 2023-02-03 DIAGNOSIS — M1711 Unilateral primary osteoarthritis, right knee: Secondary | ICD-10-CM | POA: Diagnosis not present

## 2023-02-08 ENCOUNTER — Encounter: Payer: Self-pay | Admitting: Dermatology

## 2023-02-13 DIAGNOSIS — M1711 Unilateral primary osteoarthritis, right knee: Secondary | ICD-10-CM | POA: Diagnosis not present

## 2023-02-27 DIAGNOSIS — R69 Illness, unspecified: Secondary | ICD-10-CM | POA: Diagnosis not present

## 2023-02-27 DIAGNOSIS — M1711 Unilateral primary osteoarthritis, right knee: Secondary | ICD-10-CM | POA: Diagnosis not present

## 2023-03-13 ENCOUNTER — Ambulatory Visit: Payer: Medicare HMO | Admitting: Podiatry

## 2023-03-13 ENCOUNTER — Ambulatory Visit (INDEPENDENT_AMBULATORY_CARE_PROVIDER_SITE_OTHER): Payer: Medicare HMO

## 2023-03-13 DIAGNOSIS — M958 Other specified acquired deformities of musculoskeletal system: Secondary | ICD-10-CM

## 2023-03-13 DIAGNOSIS — T8484XA Pain due to internal orthopedic prosthetic devices, implants and grafts, initial encounter: Secondary | ICD-10-CM

## 2023-03-13 DIAGNOSIS — Z9889 Other specified postprocedural states: Secondary | ICD-10-CM

## 2023-03-13 DIAGNOSIS — M7752 Other enthesopathy of left foot: Secondary | ICD-10-CM

## 2023-03-13 DIAGNOSIS — M19072 Primary osteoarthritis, left ankle and foot: Secondary | ICD-10-CM | POA: Diagnosis not present

## 2023-03-13 MED ORDER — DICLOFENAC SODIUM 75 MG PO TBEC: 75.0000 mg | DELAYED_RELEASE_TABLET | Freq: Two times a day (BID) | ORAL | 0 refills | Status: DC

## 2023-03-13 MED ORDER — METHYLPREDNISOLONE 4 MG PO TBPK: ORAL_TABLET | ORAL | 0 refills | Status: DC

## 2023-03-14 NOTE — Progress Notes (Signed)
  Subjective:  Patient ID: Pamela Jacobs, female    DOB: [birth year],  MRN: [id number]  Chief Complaint  Patient presents with   Routine Post Op    "It's hurting in my ankle."     67 y.o. female returns for post-op check.  She still having pain is hard to localize it seems sometimes it is in the ankle sometimes it is in the foot around the incisions  Review of Systems: Negative except as noted in the HPI. Denies N/V/F/Ch.   Objective:   There were no vitals filed for this visit.  There is no height or weight on file to calculate BMI. Constitutional Well developed. Well nourished.  Vascular Foot warm and well perfused. Capillary refill normal to all digits.  Calf is soft and supple, no posterior calf or knee pain, negative Homans' sign.  Mild varicosities/spider veins  Neurologic Normal speech. Oriented to person, place, and time. Epicritic sensation to light touch grossly present bilaterally.  Dermatologic Foot incisions are well-healed.  There is still residual tenderness between and on the incisions.    Orthopedic: Mild to moderate edema in foot, she does have tenderness to palpation around the surgical site as well as with palpation to the anterior joint line and with range of motion and compression of the ankle joint   Multiple view plain film radiographs: New radiographs today show continued fusion and no change in hardware alignment Assessment:   1. Capsulitis of left ankle   2. Osteochondral defect of talus   3. 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   Reviewed her radiographs.  She is still having some continuing pain.  I recommended methylprednisolone taper followed by a course of diclofenac twice daily.  Some of the pain is likely related to postoperative changes as well as the ongoing osteochondral cyst.  I discussed with  her if not better in about 1 month I would recommend CT scan to evaluate the fusion sites position of the hardware and reevaluation of the cyst and consider removal of hardware and addressing the cyst.  I will see her back in 2 months for follow-up or sooner if there are issues.    Return in about 2 months (around 05/13/2023) for surgery / ankle cyst follow up .

## 2023-03-17 ENCOUNTER — Encounter: Payer: Medicare HMO | Admitting: Obstetrics and Gynecology

## 2023-04-09 ENCOUNTER — Other Ambulatory Visit: Payer: Self-pay | Admitting: Podiatry

## 2023-04-25 DIAGNOSIS — R197 Diarrhea, unspecified: Secondary | ICD-10-CM | POA: Diagnosis not present

## 2023-04-25 DIAGNOSIS — R1013 Epigastric pain: Secondary | ICD-10-CM | POA: Diagnosis not present

## 2023-04-25 DIAGNOSIS — Z13220 Encounter for screening for lipoid disorders: Secondary | ICD-10-CM | POA: Diagnosis not present

## 2023-04-26 ENCOUNTER — Other Ambulatory Visit: Payer: Self-pay | Admitting: Physician Assistant

## 2023-04-26 DIAGNOSIS — R197 Diarrhea, unspecified: Secondary | ICD-10-CM

## 2023-04-26 DIAGNOSIS — R1013 Epigastric pain: Secondary | ICD-10-CM | POA: Diagnosis not present

## 2023-04-28 ENCOUNTER — Ambulatory Visit
Admission: RE | Admit: 2023-04-28 | Discharge: 2023-04-28 | Disposition: A | Discharge status: Home / Self Care | Payer: Medicare HMO | Source: Ambulatory Visit | Attending: Physician Assistant | Admitting: Physician Assistant

## 2023-04-28 DIAGNOSIS — R1013 Epigastric pain: Secondary | ICD-10-CM | POA: Insufficient documentation

## 2023-04-28 DIAGNOSIS — R197 Diarrhea, unspecified: Secondary | ICD-10-CM | POA: Insufficient documentation

## 2023-05-01 DIAGNOSIS — A0472 Enterocolitis due to Clostridium difficile, not specified as recurrent: Secondary | ICD-10-CM | POA: Diagnosis not present

## 2023-05-01 DIAGNOSIS — I10 Essential (primary) hypertension: Secondary | ICD-10-CM | POA: Diagnosis not present

## 2023-05-01 DIAGNOSIS — Z6835 Body mass index (BMI) 35.0-35.9, adult: Secondary | ICD-10-CM | POA: Diagnosis not present

## 2023-05-01 DIAGNOSIS — K219 Gastro-esophageal reflux disease without esophagitis: Secondary | ICD-10-CM | POA: Diagnosis not present

## 2023-05-01 DIAGNOSIS — N951 Menopausal and female climacteric states: Secondary | ICD-10-CM | POA: Diagnosis not present

## 2023-05-07 ENCOUNTER — Other Ambulatory Visit: Payer: Self-pay | Admitting: Podiatry

## 2023-05-08 ENCOUNTER — Ambulatory Visit: Payer: Medicare HMO | Admitting: Podiatry

## 2023-05-08 DIAGNOSIS — M7752 Other enthesopathy of left foot: Secondary | ICD-10-CM

## 2023-05-08 NOTE — Progress Notes (Signed)
  Subjective:  Patient ID: Pamela Jacobs, female    DOB: 1956/04/10,  MRN: [id number]  Chief Complaint  Patient presents with   Arthritis    surgery follow up ankle and cyst     67 y.o. female returns for post-op check.  Does not have stiffness and swelling in the front ankle joint especially in the morning when she gets up to start her day, improves throughout.  Not as much pain on the top of the foot now  Review of Systems: Negative except as noted in the HPI. Denies N/V/F/Ch.   Objective:   There were no vitals filed for this visit.  There is no height or weight on file to calculate BMI. Constitutional Well developed. Well nourished.  Vascular Foot warm and well perfused. Capillary refill normal to all digits.  Calf is soft and supple, no posterior calf or knee pain, negative Homans' sign.  Mild varicosities/spider veins  Neurologic Normal speech. Oriented to person, place, and time. Epicritic sensation to light touch grossly present bilaterally.  Dermatologic Foot incisions are well-healed.  There is no hypertrophy.  Sensitivity has resolved  Orthopedic: Mild to moderate edema in foot, she does have tenderness to  the anterior ankle joint line and with range of motion and compression of the ankle joint   Multiple view plain film radiographs:   radiographs show continued fusion and no change in hardware alignment Assessment:   1. Capsulitis of left ankle    Plan:  Patient was evaluated and treated and all questions answered.   Her foot pain seems to have resolved at this point.  Most of the pain is in the ankle joint itself.  I suspect overall she is having increasing tenderness in the ankle joint due to arthritic changes that are progressing.  We discussed treatment options.  Has not had corticosteroid injection into the joint before.  Would like to proceed with this today.  Following sterile prep with Betadine, 5 mg of Kenalog, 2 mg of dexamethasone and 1 cc of 0.5%  Marcaine plain was injected into the left ankle joint through medial gutter approach.  She tolerated as well.  She will know how this helps her symptoms.    Return if symptoms worsen or fail to improve.

## 2023-05-10 ENCOUNTER — Ambulatory Visit: Payer: Medicare HMO | Admitting: Dermatology

## 2023-05-23 DIAGNOSIS — K219 Gastro-esophageal reflux disease without esophagitis: Secondary | ICD-10-CM | POA: Diagnosis not present

## 2023-05-23 DIAGNOSIS — Z8619 Personal history of other infectious and parasitic diseases: Secondary | ICD-10-CM | POA: Diagnosis not present

## 2023-05-23 DIAGNOSIS — R1319 Other dysphagia: Secondary | ICD-10-CM | POA: Diagnosis not present

## 2023-05-31 ENCOUNTER — Ambulatory Visit: Payer: Medicare HMO | Admitting: Podiatry

## 2023-05-31 ENCOUNTER — Telehealth: Payer: Self-pay | Admitting: Podiatry

## 2023-05-31 ENCOUNTER — Encounter: Payer: Self-pay | Admitting: Podiatry

## 2023-05-31 DIAGNOSIS — S90212A Contusion of left great toe with damage to nail, initial encounter: Secondary | ICD-10-CM | POA: Diagnosis not present

## 2023-05-31 MED ORDER — NEOMYCIN-POLYMYXIN-HC 1 % OT SOLN: OTIC | 1 refills | Status: DC

## 2023-05-31 NOTE — Progress Notes (Signed)
She presents today with a chief complaint  Painful hallux left foot she bumped her toe on the corn table at her dentist office.  She states it is red and sore and rates the nail up and she has been using the mupirocin cream.  Objective: Vital signs are stable she is alert and oriented x 3 pulses are palpable.  Hallux nail left does demonstrate near complete avulsion.  There is no erythema cellulitis drainage or odor but the proximal nail fold is exquisitely tender and there is no pain on medial lateral compression of the proximal phalanx or the distal phalanx.  Assessment: Painful hallux nail after trauma left.  Plan: Total nail avulsion was performed today after local anesthetic was administered tolerated procedure well without complications.  Was given both oral and written home-going instructions for the care and soaking of the toe and will follow-up with me in 2 to 3 weeks.

## 2023-05-31 NOTE — Patient Instructions (Signed)

## 2023-05-31 NOTE — Telephone Encounter (Signed)
Pt called in checking on the status of her medication cortisporin otic solution  Please advise

## 2023-06-07 ENCOUNTER — Other Ambulatory Visit: Payer: Self-pay | Admitting: Podiatry

## 2023-06-26 ENCOUNTER — Ambulatory Visit: Payer: Medicare HMO | Admitting: Podiatry

## 2023-07-03 ENCOUNTER — Ambulatory Visit: Payer: Medicare HMO

## 2023-07-03 DIAGNOSIS — K2100 Gastro-esophageal reflux disease with esophagitis, without bleeding: Secondary | ICD-10-CM

## 2023-07-03 DIAGNOSIS — K449 Diaphragmatic hernia without obstruction or gangrene: Secondary | ICD-10-CM

## 2023-07-03 DIAGNOSIS — K222 Esophageal obstruction: Secondary | ICD-10-CM

## 2023-07-03 DIAGNOSIS — K2970 Gastritis, unspecified, without bleeding: Secondary | ICD-10-CM

## 2023-07-08 ENCOUNTER — Other Ambulatory Visit: Payer: Self-pay | Admitting: Podiatry

## 2023-07-28 DIAGNOSIS — Z8249 Family history of ischemic heart disease and other diseases of the circulatory system: Secondary | ICD-10-CM | POA: Diagnosis not present

## 2023-07-28 DIAGNOSIS — Z008 Encounter for other general examination: Secondary | ICD-10-CM | POA: Diagnosis not present

## 2023-07-28 DIAGNOSIS — Z818 Family history of other mental and behavioral disorders: Secondary | ICD-10-CM | POA: Diagnosis not present

## 2023-07-28 DIAGNOSIS — I10 Essential (primary) hypertension: Secondary | ICD-10-CM | POA: Diagnosis not present

## 2023-07-28 DIAGNOSIS — Z85820 Personal history of malignant melanoma of skin: Secondary | ICD-10-CM | POA: Diagnosis not present

## 2023-07-28 DIAGNOSIS — Z809 Family history of malignant neoplasm, unspecified: Secondary | ICD-10-CM | POA: Diagnosis not present

## 2023-07-28 DIAGNOSIS — M1990 Unspecified osteoarthritis, unspecified site: Secondary | ICD-10-CM | POA: Diagnosis not present

## 2023-07-28 DIAGNOSIS — Z6836 Body mass index (BMI) 36.0-36.9, adult: Secondary | ICD-10-CM | POA: Diagnosis not present

## 2023-07-28 DIAGNOSIS — K219 Gastro-esophageal reflux disease without esophagitis: Secondary | ICD-10-CM | POA: Diagnosis not present

## 2023-07-28 DIAGNOSIS — R600 Localized edema: Secondary | ICD-10-CM | POA: Diagnosis not present

## 2023-07-28 DIAGNOSIS — Z791 Long term (current) use of non-steroidal anti-inflammatories (NSAID): Secondary | ICD-10-CM | POA: Diagnosis not present

## 2023-07-28 DIAGNOSIS — Z833 Family history of diabetes mellitus: Secondary | ICD-10-CM | POA: Diagnosis not present

## 2023-08-04 DIAGNOSIS — Z1231 Encounter for screening mammogram for malignant neoplasm of breast: Secondary | ICD-10-CM | POA: Diagnosis not present

## 2023-08-07 ENCOUNTER — Other Ambulatory Visit: Payer: Self-pay | Admitting: Podiatry

## 2023-08-09 ENCOUNTER — Ambulatory Visit
Admission: RE | Admit: 2023-08-09 | Discharge: 2023-08-09 | Disposition: A | Discharge status: Home / Self Care | Payer: Medicare HMO | Source: Ambulatory Visit | Attending: Physician Assistant | Admitting: Physician Assistant

## 2023-08-09 ENCOUNTER — Other Ambulatory Visit: Payer: Self-pay | Admitting: Physician Assistant

## 2023-08-09 DIAGNOSIS — M79672 Pain in left foot: Secondary | ICD-10-CM | POA: Diagnosis not present

## 2023-08-09 DIAGNOSIS — M7989 Other specified soft tissue disorders: Secondary | ICD-10-CM | POA: Diagnosis not present

## 2023-08-31 ENCOUNTER — Ambulatory Visit: Payer: Medicare HMO | Admitting: Dermatology

## 2023-08-31 ENCOUNTER — Encounter: Payer: Self-pay | Admitting: Dermatology

## 2023-08-31 VITALS — BP 128/75 | HR 72

## 2023-08-31 DIAGNOSIS — D1801 Hemangioma of skin and subcutaneous tissue: Secondary | ICD-10-CM | POA: Diagnosis not present

## 2023-08-31 DIAGNOSIS — W908XXA Exposure to other nonionizing radiation, initial encounter: Secondary | ICD-10-CM | POA: Diagnosis not present

## 2023-08-31 DIAGNOSIS — L820 Inflamed seborrheic keratosis: Secondary | ICD-10-CM | POA: Diagnosis not present

## 2023-08-31 DIAGNOSIS — Z8589 Personal history of malignant neoplasm of other organs and systems: Secondary | ICD-10-CM

## 2023-08-31 DIAGNOSIS — L821 Other seborrheic keratosis: Secondary | ICD-10-CM

## 2023-08-31 DIAGNOSIS — Z1283 Encounter for screening for malignant neoplasm of skin: Secondary | ICD-10-CM | POA: Diagnosis not present

## 2023-08-31 DIAGNOSIS — L814 Other melanin hyperpigmentation: Secondary | ICD-10-CM

## 2023-08-31 DIAGNOSIS — L578 Other skin changes due to chronic exposure to nonionizing radiation: Secondary | ICD-10-CM

## 2023-08-31 DIAGNOSIS — Z85828 Personal history of other malignant neoplasm of skin: Secondary | ICD-10-CM | POA: Diagnosis not present

## 2023-08-31 NOTE — Patient Instructions (Signed)

## 2023-08-31 NOTE — Progress Notes (Signed)
Follow-Up Visit   Subjective  Pamela Jacobs is a 67 y.o. female who presents for the following: Skin Cancer Screening and Full Body Skin Exam  The patient presents for Total-Body Skin Exam (TBSE) for skin cancer screening and mole check. The patient has spots, moles and lesions to be evaluated, some may be new or changing and the patient may have concern these could be cancer.   The following portions of the chart were reviewed this encounter and updated as appropriate: medications, allergies, medical history  Review of Systems:  No other skin or systemic complaints except as noted in HPI or Assessment and Plan.  Objective  Well appearing patient in no apparent distress; mood and affect are within normal limits.  A full examination was performed including scalp, head, eyes, ears, nose, lips, neck, chest, axillae, abdomen, back, buttocks, bilateral upper extremities, bilateral lower extremities, hands, feet, fingers, toes, fingernails, and toenails. All findings within normal limits unless otherwise noted below.   Relevant physical exam findings are noted in the Assessment and Plan. R forearm x 2, L forearm x 1, neck x 10, back x 2 (15) Erythematous stuck-on, waxy papule or plaque    Assessment & Plan   SKIN CANCER SCREENING PERFORMED TODAY.  ACTINIC DAMAGE - Chronic condition, secondary to cumulative UV/sun exposure - diffuse scaly erythematous macules with underlying dyspigmentation - Recommend daily broad spectrum sunscreen SPF 30+ to sun-exposed areas, reapply every 2 hours as needed.  - Staying in the shade or wearing long sleeves, sun glasses (UVA+UVB protection) and wide brim hats (4-inch brim around the entire circumference of the hat) are also recommended for sun protection.  - Call for new or changing lesions.  LENTIGINES, SEBORRHEIC KERATOSES, HEMANGIOMAS - Benign normal skin lesions - Benign-appearing - Call for any changes  MELANOCYTIC NEVI - Tan-brown and/or  pink-flesh-colored symmetric macules and papules - Benign appearing on exam today - Observation - Call clinic for new or changing moles - Recommend daily use of broad spectrum spf 30+ sunscreen to sun-exposed areas.   HISTORY OF BASAL CELL CARCINOMA OF THE SKIN - No evidence of recurrence today - Recommend regular full body skin exams - Recommend daily broad spectrum sunscreen SPF 30+ to sun-exposed areas, reapply every 2 hours as needed.  - Call if any new or changing lesions are noted between office visits  HISTORY OF SQUAMOUS CELL CARCINOMA OF THE SKIN - No evidence of recurrence today - No lymphadenopathy - Recommend regular full body skin exams - Recommend daily broad spectrum sunscreen SPF 30+ to sun-exposed areas, reapply every 2 hours as needed.  - Call if any new or changing lesions are noted between office visits  Inflamed seborrheic keratosis (15) R forearm x 2, L forearm x 1, neck x 10, back x 2  Symptomatic, irritating, patient would like treated.   Destruction of lesion - R forearm x 2, L forearm x 1, neck x 10, back x 2 (15) Complexity: simple   Destruction method: cryotherapy   Informed consent: discussed and consent obtained   Timeout:  patient name, date of birth, surgical site, and procedure verified Lesion destroyed using liquid nitrogen: Yes   Region frozen until ice ball extended beyond lesion: Yes   Outcome: patient tolerated procedure well with no complications   Post-procedure details: wound care instructions given     Return in about 1 year (around 08/30/2024).  Maylene Roes, CMA, am acting as scribe for Armida Sans, MD .   Documentation: I  have reviewed the above documentation for accuracy and completeness, and I agree with the above.  Armida Sans, MD

## 2023-09-01 DIAGNOSIS — G8929 Other chronic pain: Secondary | ICD-10-CM | POA: Diagnosis not present

## 2023-09-01 DIAGNOSIS — M25572 Pain in left ankle and joints of left foot: Secondary | ICD-10-CM | POA: Diagnosis not present

## 2023-09-01 DIAGNOSIS — M216X1 Other acquired deformities of right foot: Secondary | ICD-10-CM | POA: Diagnosis not present

## 2023-09-01 DIAGNOSIS — M216X2 Other acquired deformities of left foot: Secondary | ICD-10-CM | POA: Diagnosis not present

## 2023-09-01 DIAGNOSIS — M79672 Pain in left foot: Secondary | ICD-10-CM | POA: Diagnosis not present

## 2023-09-01 DIAGNOSIS — I890 Lymphedema, not elsewhere classified: Secondary | ICD-10-CM | POA: Diagnosis not present

## 2023-09-01 DIAGNOSIS — M76822 Posterior tibial tendinitis, left leg: Secondary | ICD-10-CM | POA: Diagnosis not present

## 2023-09-01 DIAGNOSIS — G629 Polyneuropathy, unspecified: Secondary | ICD-10-CM | POA: Diagnosis not present

## 2023-09-01 DIAGNOSIS — M19072 Primary osteoarthritis, left ankle and foot: Secondary | ICD-10-CM | POA: Diagnosis not present

## 2023-09-01 DIAGNOSIS — I872 Venous insufficiency (chronic) (peripheral): Secondary | ICD-10-CM | POA: Diagnosis not present

## 2023-09-06 ENCOUNTER — Other Ambulatory Visit: Payer: Self-pay | Admitting: Podiatry

## 2023-10-08 ENCOUNTER — Other Ambulatory Visit: Payer: Self-pay | Admitting: Podiatry

## 2023-10-09 ENCOUNTER — Encounter (INDEPENDENT_AMBULATORY_CARE_PROVIDER_SITE_OTHER): Payer: Self-pay | Admitting: Nurse Practitioner

## 2023-10-09 ENCOUNTER — Ambulatory Visit (INDEPENDENT_AMBULATORY_CARE_PROVIDER_SITE_OTHER): Payer: Medicare HMO | Admitting: Nurse Practitioner

## 2023-10-09 VITALS — BP 134/82 | HR 97 | Resp 18 | Ht 64.0 in | Wt 211.4 lb

## 2023-10-09 DIAGNOSIS — M7989 Other specified soft tissue disorders: Secondary | ICD-10-CM

## 2023-10-09 DIAGNOSIS — R69 Illness, unspecified: Secondary | ICD-10-CM | POA: Diagnosis not present

## 2023-10-09 NOTE — Progress Notes (Signed)
Subjective:    Patient ID: Pamela Jacobs, female    DOB: August 03, 1956, 67 y.o.   MRN: [id number] Chief Complaint  Patient presents with   New Patient (Initial Visit)    np. consult. lymphedema. baker, andrew    Pamela Jacobs Service is a 67 year old female who presents today as a referral from Dr. Excell Seltzer in regards to left lower extremity edema.  The swelling occurred after recent foot surgery approximately 13 months ago.  Following the surgery the patient's swelling never decreased to preoperative levels.  She was told by the original surgeon that there were no other options for her and so she sought a second opinion with Dr. Excell Seltzer.  Since the patient surgery she has been utilizing medical grade compression stockings.  She notes that they are helpful controlling her swelling but it seems she removes them and leaves her feet down for extended.  Today began to swell all over again.  She also elevates her legs when possible.  Currently there are no open wounds or ulcerations.    Review of Systems  Cardiovascular:  Positive for leg swelling.  All other systems reviewed and are negative.      Objective:   Physical Exam Vitals reviewed.  HENT:     Head: Normocephalic.  Cardiovascular:     Rate and Rhythm: Normal rate.     Pulses: Normal pulses.  Pulmonary:     Effort: Pulmonary effort is normal.  Musculoskeletal:     Left lower leg: Edema present.  Skin:    General: Skin is warm and dry.     Capillary Refill: Capillary refill takes less than 2 seconds.  Neurological:     Mental Status: She is alert and oriented to person, place, and time.  Psychiatric:        Mood and Affect: Mood normal.        Behavior: Behavior normal.        Thought Content: Thought content normal.        Judgment: Judgment normal.     BP 134/82 (BP Location: Left Arm)   Pulse 97   Resp 18   Ht 5\' 4"  (1.626 m)   Wt 211 lb 6.4 oz (95.9 kg)   BMI 36.29 kg/m   Past Medical History:  Diagnosis Date    Arthritis    Basal cell carcinoma    GERD (gastroesophageal reflux disease)    Skin cancer    Squamous cell carcinoma of skin     Social History   Socioeconomic History   Marital status: Married    Spouse name: Not on file   Number of children: Not on file   Years of education: Not on file   Highest education level: Not on file  Occupational History   Not on file  Tobacco Use   Smoking status: Never   Smokeless tobacco: Never  Vaping Use   Vaping status: Never Used  Substance and Sexual Activity   Alcohol use: Yes    Alcohol/week: 0.0 standard drinks of alcohol    Comment: occasionaly   Drug use: No   Sexual activity: Yes    Birth control/protection: Post-menopausal  Other Topics Concern   Not on file  Social History Narrative   Not on file   Social Determinants of Health   Financial Resource Strain: Low Risk  (04/24/2023)   Received from [facility], [facility]   Overall Financial Resource Strain (CARDIA)    Difficulty of Paying Living  Expenses: Not hard at all  Food Insecurity: No Food Insecurity (04/24/2023)   Received from [facility], [facility]   Hunger Vital Sign    Worried About Running Out of Food in the Last Year: Never true    Ran Out of Food in the Last Year: Never true  Transportation Needs: No Transportation Needs (04/24/2023)   Received from [facility], [facility]   [facility] - Transportation    In the past 12 months, has lack of transportation kept you from medical appointments or from getting medications?: No    Lack of Transportation (Non-Medical): No  Physical Activity: Inactive (01/24/2018)   Exercise Vital Sign    Days of Exercise per Week: 0 days    Minutes of Exercise per Session: 0 min  Stress: Not on file  Social Connections: Not on file  Intimate Partner Violence: Not on file    Past Surgical History:  Procedure Laterality Date    BREAST EXCISIONAL BIOPSY Left 2000   benign   CESAREAN SECTION  1610,9604   COLONOSCOPY WITH PROPOFOL N/A 07/04/2017   Procedure: COLONOSCOPY WITH PROPOFOL;  Surgeon: Midge Minium, MD;  Location: [facility] ENDOSCOPY;  Service: Endoscopy;  Laterality: N/A;   DILATION AND CURETTAGE OF UTERUS     FOOT SURGERY  2012   plate placed   FOOT SURGERY Left    October 2023   NASAL SEPTUM SURGERY  1975   TUBAL LIGATION      Family History  Problem Relation Age of Onset   Hypertension Father    Bladder Cancer Neg Hx    Kidney cancer Neg Hx    Prostate cancer Neg Hx    Cancer Neg Hx    Diabetes Neg Hx    Heart disease Neg Hx    Ovarian cancer Neg Hx    Breast cancer Neg Hx     No Known Allergies     Latest Ref Rng & Units 01/02/2014    9:31 AM  CBC  WBC 3.6 - 11.0 x10 3/mm 3 8.0   Hemoglobin 12.0 - 16.0 g/dL 54.0   Hematocrit 98.1 - 47.0 % 40.2   Platelets 150 - 440 x10 3/mm 3 188       CMP     Component Value Date/Time   NA 135 (L) 01/02/2014 0931   K 3.8 01/02/2014 0931   CL 107 01/02/2014 0931   CO2 26 01/02/2014 0931   GLUCOSE 89 01/02/2014 0931   BUN 15 01/02/2014 0931   CREATININE 0.70 01/02/2014 0931   CALCIUM 9.2 01/02/2014 0931   GFRNONAA >60 01/02/2014 0931     No results found.     Assessment & Plan:   1. Leg swelling The patient has had ongoing swelling since her surgery.  I had a discussion with the patient that some of this may be related to venous insufficiency as she does have some notable varicosities on her lower extremities.  However this may be due to postsurgical changes resulting in lymphedema and leg swelling.  Patient is advised to continue with use of her medical grade compression stockings.  She should continue to wear them for [facility] in the morning and remove them at bedtime and utilize them daily.  She should also continue to elevate her lower extremities when possible and attempt walking 15 to 20 minutes 3 to 4 days/week.  Will have her return  her convenience for venous reflux studies to determine if there is  an underlying venous insufficiency which may be treatable versus discussion about possible lymphedema pump.   Current Outpatient Medications on File Prior to Visit  Medication Sig Dispense Refill   diclofenac (VOLTAREN) 75 MG EC tablet TAKE 1 TABLET BY MOUTH TWICE A DAY 60 tablet 0   losartan (COZAAR) 25 MG tablet TAKE 1 TABLET(25 MG) BY MOUTH AT BEDTIME     omeprazole (PRILOSEC) 40 MG capsule TK 1 C PO D     mupirocin ointment (BACTROBAN) 2 % Apply 1 Application topically daily. (Patient not taking: Reported on 08/31/2023) 30 g 2   No current facility-administered medications on file prior to visit.    There are no Patient Instructions on file for this visit. No follow-ups on file.   Georgiana Spinner, NP

## 2023-10-10 DIAGNOSIS — R69 Illness, unspecified: Secondary | ICD-10-CM | POA: Diagnosis not present

## 2023-10-25 DIAGNOSIS — Z9889 Other specified postprocedural states: Secondary | ICD-10-CM | POA: Diagnosis not present

## 2023-10-25 DIAGNOSIS — Z8719 Personal history of other diseases of the digestive system: Secondary | ICD-10-CM | POA: Diagnosis not present

## 2023-10-25 DIAGNOSIS — K219 Gastro-esophageal reflux disease without esophagitis: Secondary | ICD-10-CM | POA: Diagnosis not present

## 2023-10-25 DIAGNOSIS — K449 Diaphragmatic hernia without obstruction or gangrene: Secondary | ICD-10-CM | POA: Diagnosis not present

## 2023-10-30 ENCOUNTER — Ambulatory Visit
Admission: RE | Admit: 2023-10-30 | Discharge: 2023-10-30 | Disposition: A | Discharge status: Home / Self Care | Payer: Medicare HMO | Source: Ambulatory Visit

## 2023-10-30 VITALS — BP 116/96 | HR 81 | Temp 98.6°F | Resp 20

## 2023-10-30 DIAGNOSIS — J069 Acute upper respiratory infection, unspecified: Secondary | ICD-10-CM

## 2023-10-30 LAB — POCT RAPID STREP A (OFFICE): Rapid Strep A Screen: NEGATIVE

## 2023-10-30 MED ORDER — AZITHROMYCIN 250 MG PO TABS: 250.0000 mg | ORAL_TABLET | Freq: Every day | ORAL | 0 refills | Status: DC

## 2023-10-30 MED ORDER — NYSTATIN 100000 UNIT/ML MT SUSP: 5.0000 mL | Freq: Four times a day (QID) | OROMUCOSAL | 0 refills | Status: DC | PRN

## 2023-10-30 NOTE — ED Provider Notes (Signed)
Pamela Jacobs    CSN: [id number] Arrival date & time: 10/30/23  1354      History   Chief Complaint Chief Complaint  Patient presents with   Sore Throat    Head congestion as well. - Entered by patient    HPI Pamela Jacobs is a 67 y.o. female.   Patient presents for evaluation of nasal congestion, rhinorrhea, nonproductive cough, sore throat, sensation of having to take a deep breath present for 3 days.  Has begun to experience left-sided ear pain today.  Associated body aches but denies presence of fever.  No known sick contacts prior.  Decreased appetite but tolerating some food and fluids.  Denies respiratory history, non-smoker.  Past Medical History:  Diagnosis Date   Arthritis    Basal cell carcinoma    GERD (gastroesophageal reflux disease)    Skin cancer    Squamous cell carcinoma of skin     Patient Active Problem List   Diagnosis Date Noted   Special screening for malignant neoplasms, colon    Increased BMI 01/12/2016   Menopause 01/12/2016   Urinary urgency 08/10/2015   Nocturia 08/10/2015    Past Surgical History:  Procedure Laterality Date   BREAST EXCISIONAL BIOPSY Left 2000   benign   CESAREAN SECTION  [id number]   COLONOSCOPY WITH PROPOFOL N/A 07/04/2017   Procedure: COLONOSCOPY WITH PROPOFOL;  Surgeon: Midge Minium, MD;  Location: [facility] ENDOSCOPY;  Service: Endoscopy;  Laterality: N/A;   DILATION AND CURETTAGE OF UTERUS     FOOT SURGERY  2012   plate placed   FOOT SURGERY Left    October 2023   NASAL SEPTUM SURGERY  1975   TUBAL LIGATION      OB History     Gravida  3   Para  2   Term  2   Preterm      AB  1   Living  2      SAB  1   IAB      Ectopic      Multiple      Live Births  2            Home Medications    Prior to Admission medications   Medication Sig Start Date End Date Taking? Authorizing Provider  azithromycin (ZITHROMAX) 250 MG tablet Take 1 tablet (250 mg total) by mouth daily. Take  first 2 tablets together, then 1 every day until finished. 11/05/23  Yes Pamela Peeters R, NP  diclofenac (VOLTAREN) 75 MG EC tablet TAKE 1 TABLET BY MOUTH TWICE A DAY 10/08/23  Yes Standiford, Jenelle Mages, DPM  losartan (COZAAR) 25 MG tablet TAKE 1 TABLET(25 MG) BY MOUTH AT BEDTIME 07/15/22  Yes [provider]  magic mouthwash (nystatin, lidocaine, diphenhydrAMINE, alum & mag hydroxide) suspension Swish and spit 5 mLs 4 (four) times daily as needed for mouth pain. 10/30/23  Yes Valinda Hoar, NP  omeprazole (PRILOSEC) 40 MG capsule TK 1 C PO D 05/23/19  Yes [provider]  Turmeric (QC TUMERIC COMPLEX PO) Take by mouth.   Yes [provider]  mupirocin ointment (BACTROBAN) 2 % Apply 1 Application topically daily. Patient not taking: Reported on 08/31/2023 11/03/22   Edwin Cap, DPM    Family History Family History  Problem Relation Age of Onset   Hypertension Father    Bladder Cancer Neg Hx    Kidney cancer Neg Hx    Prostate cancer Neg Hx  Cancer Neg Hx    Diabetes Neg Hx    Heart disease Neg Hx    Ovarian cancer Neg Hx    Breast cancer Neg Hx     Social History Social History   Tobacco Use   Smoking status: Never   Smokeless tobacco: Never  Vaping Use   Vaping status: Never Used  Substance Use Topics   Alcohol use: Yes    Alcohol/week: 0.0 standard drinks of alcohol    Comment: occasionaly   Drug use: No     Allergies   Patient has no known allergies.   Review of Systems Review of Systems   Physical Exam Triage Vital Signs ED Triage Vitals  Encounter Vitals Group     BP 10/30/23 1414 (!) 116/96     Systolic BP Percentile --      Diastolic BP Percentile --      Pulse Rate 10/30/23 1414 (!) 45     Resp 10/30/23 1414 17     Temp 10/30/23 1414 98.6 F (37 C)     Temp Source 10/30/23 1414 Oral     SpO2 10/30/23 1414 97 %     Weight --      Height --      Head Circumference --      Peak Flow --      Pain Score 10/30/23  1426 0     Pain Loc --      Pain Education --      Exclude from Growth Chart --    No data found.  Updated Vital Signs BP (!) 116/96 (BP Location: Left Arm)   Pulse 81   Temp 98.6 F (37 C) (Oral)   Resp 20   SpO2 96%   Visual Acuity Right Eye Distance:   Left Eye Distance:   Bilateral Distance:    Right Eye Near:   Left Eye Near:    Bilateral Near:     Physical Exam Constitutional:      Appearance: Normal appearance.  HENT:     Head: Normocephalic.     Right Ear: Tympanic membrane, ear canal and external ear normal.     Left Ear: Tympanic membrane, ear canal and external ear normal.     Nose: Congestion present. No rhinorrhea.     Mouth/Throat:     Pharynx: Posterior oropharyngeal erythema present. No oropharyngeal exudate.  Eyes:     Extraocular Movements: Extraocular movements intact.  Cardiovascular:     Rate and Rhythm: Normal rate and regular rhythm.     Pulses: Normal pulses.     Heart sounds: Normal heart sounds.  Pulmonary:     Effort: Pulmonary effort is normal.     Breath sounds: Normal breath sounds.  Neurological:     Mental Status: She is alert and oriented to person, place, and time. Mental status is at baseline.      UC Treatments / Results  Labs (all labs ordered are listed, but only abnormal results are displayed) Labs Reviewed  POCT RAPID STREP A (OFFICE)    EKG   Radiology No results found.  Procedures Procedures (including critical care time)  Medications Ordered in UC Medications - No data to display  Initial Impression / Assessment and Plan / UC Course  I have reviewed the triage vital signs and the nursing notes.  Pertinent labs & imaging results that were available during my care of the patient were reviewed by me and considered in my medical decision making (see chart  for details).  Viral URI with cough  Patient is in no signs of distress nor toxic appearing.  Vital signs are stable.  Low suspicion for pneumonia,  pneumothorax or bronchitis and therefore will defer imaging.  Rapid strep test negative.  Prescribed Magic mouthwash for sore throat is most worrisome symptom.  Watch wait antibiotic placed at pharmacy for day 10 if no improvement seen, azithromycin sent.May use additional over-the-counter medications as needed for supportive care.  May follow-up with urgent care as needed if symptoms persist or worsen.   Final Clinical Impressions(s) / UC Diagnoses   Final diagnoses:  Viral URI with cough     Discharge Instructions      Your symptoms today are most likely being caused by a virus and should steadily improve in time it can take up to 7 to 10 days before you truly start to see a turnaround however things will get better if no improvement seen by sending to begin use of azithromycin for bacterial coverage  May gargle and spit Magic mouthwash solution every 4 hours as needed to provide temporary relief to your throat, may can take this in addition to over-the-counter medications    You can take Tylenol and/or Ibuprofen as needed for fever reduction and pain relief.   For cough: honey 1/2 to 1 teaspoon (you can dilute the honey in water or another fluid).  You can also use guaifenesin and dextromethorphan for cough. You can use a humidifier for chest congestion and cough.  If you don't have a humidifier, you can sit in the bathroom with the hot shower running.      For sore throat: try warm salt water gargles, cepacol lozenges, throat spray, warm tea or water with lemon/honey, popsicles or ice, or OTC cold relief medicine for throat discomfort.   For congestion: take a daily anti-histamine like Zyrtec, Claritin, and a oral decongestant, such as pseudoephedrine.  You can also use Flonase 1-2 sprays in each nostril daily.   It is important to stay hydrated: drink plenty of fluids (water, gatorade/powerade/pedialyte, juices, or teas) to keep your throat moisturized and help further relieve  irritation/discomfort.    ED Prescriptions     Medication Sig Dispense Auth. Provider   magic mouthwash (nystatin, lidocaine, diphenhydrAMINE, alum & mag hydroxide) suspension Swish and spit 5 mLs 4 (four) times daily as needed for mouth pain. 180 mL Deontra Pereyra R, NP   azithromycin (ZITHROMAX) 250 MG tablet Take 1 tablet (250 mg total) by mouth daily. Take first 2 tablets together, then 1 every day until finished. 6 tablet Valinda Hoar, NP      PDMP not reviewed this encounter.   Valinda Hoar, NP 10/30/23 [phone]

## 2023-10-30 NOTE — ED Triage Notes (Signed)
Pt reports being around grandchildren and having sore throat, congestion x 3 days.  Reports fatigue, left ear pain starting this morning. Tried Dayquil.  Denis CP, lightheadedness, dizziness, etc.

## 2023-10-30 NOTE — Discharge Instructions (Signed)
Your symptoms today are most likely being caused by a virus and should steadily improve in time it can take up to 7 to 10 days before you truly start to see a turnaround however things will get better if no improvement seen by sending to begin use of azithromycin for bacterial coverage  May gargle and spit Magic mouthwash solution every 4 hours as needed to provide temporary relief to your throat, may can take this in addition to over-the-counter medications    You can take Tylenol and/or Ibuprofen as needed for fever reduction and pain relief.   For cough: honey 1/2 to 1 teaspoon (you can dilute the honey in water or another fluid).  You can also use guaifenesin and dextromethorphan for cough. You can use a humidifier for chest congestion and cough.  If you don't have a humidifier, you can sit in the bathroom with the hot shower running.      For sore throat: try warm salt water gargles, cepacol lozenges, throat spray, warm tea or water with lemon/honey, popsicles or ice, or OTC cold relief medicine for throat discomfort.   For congestion: take a daily anti-histamine like Zyrtec, Claritin, and a oral decongestant, such as pseudoephedrine.  You can also use Flonase 1-2 sprays in each nostril daily.   It is important to stay hydrated: drink plenty of fluids (water, gatorade/powerade/pedialyte, juices, or teas) to keep your throat moisturized and help further relieve irritation/discomfort.

## 2023-11-02 ENCOUNTER — Other Ambulatory Visit (INDEPENDENT_AMBULATORY_CARE_PROVIDER_SITE_OTHER): Payer: Self-pay | Admitting: Nurse Practitioner

## 2023-11-02 DIAGNOSIS — M7989 Other specified soft tissue disorders: Secondary | ICD-10-CM

## 2023-11-06 ENCOUNTER — Ambulatory Visit (INDEPENDENT_AMBULATORY_CARE_PROVIDER_SITE_OTHER): Payer: Medicare HMO

## 2023-11-06 ENCOUNTER — Ambulatory Visit (INDEPENDENT_AMBULATORY_CARE_PROVIDER_SITE_OTHER): Payer: Medicare HMO | Admitting: Vascular Surgery

## 2023-11-06 ENCOUNTER — Encounter (INDEPENDENT_AMBULATORY_CARE_PROVIDER_SITE_OTHER): Payer: Self-pay | Admitting: Vascular Surgery

## 2023-11-06 VITALS — BP 148/84 | HR 68 | Resp 16 | Wt 211.8 lb

## 2023-11-06 DIAGNOSIS — K219 Gastro-esophageal reflux disease without esophagitis: Secondary | ICD-10-CM | POA: Diagnosis not present

## 2023-11-06 DIAGNOSIS — I872 Venous insufficiency (chronic) (peripheral): Secondary | ICD-10-CM | POA: Diagnosis not present

## 2023-11-06 DIAGNOSIS — I10 Essential (primary) hypertension: Secondary | ICD-10-CM | POA: Diagnosis not present

## 2023-11-06 DIAGNOSIS — I8310 Varicose veins of unspecified lower extremity with inflammation: Secondary | ICD-10-CM | POA: Insufficient documentation

## 2023-11-06 DIAGNOSIS — I890 Lymphedema, not elsewhere classified: Secondary | ICD-10-CM

## 2023-11-06 DIAGNOSIS — M7989 Other specified soft tissue disorders: Secondary | ICD-10-CM

## 2023-11-06 NOTE — Progress Notes (Signed)
MRN : [id number]  Pamela Jacobs is a 67 y.o. ([birth year]) female who presents with chief complaint of varicose veins hurt.  History of Present Illness:   The patient returns to the office for followup evaluation regarding leg swelling.  The swelling has persisted and the pain associated with swelling continues. There have not been any interval development of a ulcerations or wounds.  Since the previous visit the patient has been wearing graduated compression stockings and has noted little if any improvement in the lymphedema. The patient has been using compression routinely morning until night.  The patient also states elevation during the day and exercise is being done too.  Duplex ultrasound of the venous system the left lower extremity demonstrates normal deep venous system.  No evidence of  significant superficial reflux.  No outpatient medications have been marked as taking for the 11/06/23 encounter (Appointment) with Gilda Crease, Latina Craver, MD.    Past Medical History:  Diagnosis Date   Arthritis    Basal cell carcinoma    GERD (gastroesophageal reflux disease)    Skin cancer    Squamous cell carcinoma of skin     Past Surgical History:  Procedure Laterality Date   BREAST EXCISIONAL BIOPSY Left 2000   benign   CESAREAN SECTION  2952,8413   COLONOSCOPY WITH PROPOFOL N/A 07/04/2017   Procedure: COLONOSCOPY WITH PROPOFOL;  Surgeon: Midge Minium, MD;  Location: [facility] ENDOSCOPY;  Service: Endoscopy;  Laterality: N/A;   DILATION AND CURETTAGE OF UTERUS     FOOT SURGERY  2012   plate placed   FOOT SURGERY Left    October 2023   NASAL SEPTUM SURGERY  1975   TUBAL LIGATION      Social History Social History   Tobacco Use   Smoking status: Never   Smokeless tobacco: Never  Vaping Use   Vaping status: Never Used  Substance Use Topics   Alcohol use: Yes    Alcohol/week: 0.0 standard drinks of alcohol    Comment: occasionaly   Drug use: No    Family  History Family History  Problem Relation Age of Onset   Hypertension Father    Bladder Cancer Neg Hx    Kidney cancer Neg Hx    Prostate cancer Neg Hx    Cancer Neg Hx    Diabetes Neg Hx    Heart disease Neg Hx    Ovarian cancer Neg Hx    Breast cancer Neg Hx     No Known Allergies   REVIEW OF SYSTEMS (Negative unless checked)  Constitutional: [] Weight loss  [] Fever  [] Chills Cardiac: [] Chest pain   [] Chest pressure   [] Palpitations   [] Shortness of breath when laying flat   [] Shortness of breath with exertion. Vascular:  [] Pain in legs with walking   [x] Pain in legs with standing  [] History of DVT   [] Phlebitis   [] Swelling in legs   [x] Varicose veins   [] Non-healing ulcers Pulmonary:   [] Uses home oxygen   [] Productive cough   [] Hemoptysis   [] Wheeze  [] COPD   [] Asthma Neurologic:  [] Dizziness   [] Seizures   [] History of stroke   [] History of TIA  [] Aphasia   [] Vissual changes   [] Weakness or numbness in arm   [] Weakness or numbness in leg Musculoskeletal:   [] Joint swelling   [] Joint pain   [] Low back pain Hematologic:  [] Easy bruising  [] Easy bleeding   [] Hypercoagulable state   []   Anemic Gastrointestinal:  [] Diarrhea   [] Vomiting  [x] Gastroesophageal reflux/heartburn   [] Difficulty swallowing. Genitourinary:  [] Chronic kidney disease   [] Difficult urination  [] Frequent urination   [] Blood in urine Skin:  [] Rashes   [] Ulcers  Psychological:  [] History of anxiety   []  History of major depression.  Physical Examination  There were no vitals filed for this visit. There is no height or weight on file to calculate BMI. Gen: WD/WN, NAD Head: [place]/AT, No temporalis wasting.  Ear/Nose/Throat: Hearing grossly intact, nares w/o erythema or drainage, pinna without lesions Eyes: PER, EOMI, sclera nonicteric.  Neck: Supple, no gross masses.  No JVD.  Pulmonary:  Good air movement, no audible wheezing, no use of accessory muscles.  Cardiac: RRR, precordium not hyperdynamic. Vascular:   scattered varicosities present bilaterally.  Mild venous stasis changes to the legs bilaterally.  3-4+ soft pitting edema left greater than right, CEAP C4sEpAsPr  Vessel Right Left  Radial Palpable Palpable  Gastrointestinal: soft, non-distended. No guarding/no peritoneal signs.  Musculoskeletal: M/S 5/5 throughout.  No deformity.  Neurologic: CN 2-12 intact. Pain and light touch intact in extremities.  Symmetrical.  Speech is fluent. Motor exam as listed above. Psychiatric: Judgment intact, Mood & affect appropriate for pt's clinical situation. Dermatologic: Venous rashes no ulcers noted.  No changes consistent with cellulitis. Lymph : No lichenification or skin changes of chronic lymphedema.  CBC Lab Results  Component Value Date   WBC 8.0 01/02/2014   HGB 13.8 01/02/2014   HCT 40.2 01/02/2014   MCV 96 01/02/2014   PLT 188 01/02/2014    BMET    Component Value Date/Time   NA 135 (L) 01/02/2014 0931   K 3.8 01/02/2014 0931   CL 107 01/02/2014 0931   CO2 26 01/02/2014 0931   GLUCOSE 89 01/02/2014 0931   BUN 15 01/02/2014 0931   CREATININE 0.70 01/02/2014 0931   CALCIUM 9.2 01/02/2014 0931   GFRNONAA >60 01/02/2014 0931   GFRAA >60 01/02/2014 0931   CrCl cannot be calculated (Patient's most recent lab result is older than the maximum 21 days allowed.).  COAG No results found for: "INR", "PROTIME"  Radiology No results found.   Assessment/Plan 1. Lymphedema Recommend:  No surgery or intervention at this point in time.   The Patient is CEAP C4sEpAsPr.  The patient has been wearing compression for more than 12 weeks with no or little benefit.  The patient has been exercising daily for more than 12 weeks. The patient has been elevating and taking OTC pain medications for more than 12 weeks.  None of these have have eliminated the pain related to the lymphedema or the discomfort regarding excessive swelling and venous congestion.    I have reviewed my discussion with the  patient regarding lymphedema and why it  causes symptoms.  Patient will continue wearing graduated compression on a daily basis. The patient should put the compression on first thing in the morning and removing them in the evening. The patient should not sleep in the compression.   In addition, behavioral modification throughout the day will be continued.  This will include frequent elevation (such as in a recliner), use of over the counter pain medications as needed and exercise such as walking.  The systemic causes for chronic edema such as liver, kidney and cardiac etiologies do not appear to have significant changed over the past year.    The patient has chronic , severe lymphedema with hyperpigmentation of the skin and has done MLD, skin care,  medication, diet, exercise, elevation and compression for 4 weeks with no improvement,  I am recommending a lymphedema pump.  The patient still has stage 3 lymphedema and therefore, I believe that a lymph pump is needed to improve the control of the patient's lymphedema and improve the quality of life.  Additionally, a lymph pump is warranted because it will reduce the risk of cellulitis and ulceration in the future.  Patient should follow-up in six months   2. Chronic venous insufficiency Recommend:  No surgery or intervention at this point in time.   The Patient is CEAP C4sEpAsPr.  The patient has been wearing compression for more than 12 weeks with no or little benefit.  The patient has been exercising daily for more than 12 weeks. The patient has been elevating and taking OTC pain medications for more than 12 weeks.  None of these have have eliminated the pain related to the lymphedema or the discomfort regarding excessive swelling and venous congestion.    I have reviewed my discussion with the patient regarding lymphedema and why it  causes symptoms.  Patient will continue wearing graduated compression on a daily basis. The patient should put the  compression on first thing in the morning and removing them in the evening. The patient should not sleep in the compression.   In addition, behavioral modification throughout the day will be continued.  This will include frequent elevation (such as in a recliner), use of over the counter pain medications as needed and exercise such as walking.  The systemic causes for chronic edema such as liver, kidney and cardiac etiologies do not appear to have significant changed over the past year.    The patient has chronic , severe lymphedema with hyperpigmentation of the skin and has done MLD, skin care, medication, diet, exercise, elevation and compression for 4 weeks with no improvement,  I am recommending a lymphedema pump.  The patient still has stage 3 lymphedema and therefore, I believe that a lymph pump is needed to improve the control of the patient's lymphedema and improve the quality of life.  Additionally, a lymph pump is warranted because it will reduce the risk of cellulitis and ulceration in the future.  Patient should follow-up in six months   3. Essential hypertension Continue antihypertensive medications as already ordered, these medications have been reviewed and there are no changes at this time.  4. Gastroesophageal reflux disease without esophagitis Continue PPI as already ordered, this medication has been reviewed and there are no changes at this time.  Avoidence of caffeine and alcohol  Moderate elevation of the head of the bed    Levora Dredge, MD  11/06/2023 1:05 PM

## 2023-11-07 ENCOUNTER — Other Ambulatory Visit: Payer: Self-pay | Admitting: Podiatry

## 2023-12-12 ENCOUNTER — Ambulatory Visit (INDEPENDENT_AMBULATORY_CARE_PROVIDER_SITE_OTHER): Payer: Medicare Other

## 2023-12-12 ENCOUNTER — Telehealth: Payer: Self-pay | Admitting: Emergency Medicine

## 2023-12-12 ENCOUNTER — Ambulatory Visit
Admission: RE | Admit: 2023-12-12 | Discharge: 2023-12-12 | Disposition: A | Discharge status: Home / Self Care | Payer: Medicare Other | Source: Ambulatory Visit | Attending: Emergency Medicine | Admitting: Emergency Medicine

## 2023-12-12 VITALS — BP 120/78 | HR 84 | Temp 98.2°F | Resp 18

## 2023-12-12 DIAGNOSIS — M25572 Pain in left ankle and joints of left foot: Secondary | ICD-10-CM | POA: Diagnosis not present

## 2023-12-12 NOTE — ED Provider Notes (Signed)
 [name]    CSN: [id number] Arrival date & time: 12/12/23  0801      History   Chief Complaint Chief Complaint  Patient presents with   Ankle Pain    Fell on ice this morning.  Have had surgery in last year on this foot.  Ankle is very tender. - Entered by patient    HPI Pamela Jacobs is a 68 y.o. female.   Patient presents for evaluation of left ankle pain beginning 1 day ago after fall.  Endorses that she slipped on ice leg sliding outwards landing onto the left knee.  Pain exacerbated when bearing weight but able to complete all range of motion.  Denies change in numbness or tingling from baseline.  Has attempted use of ibuprofen and ice and compression stockings which have been somewhat helpful.  Endorses prior injuries requiring surgical intervention.  Past Medical History:  Diagnosis Date   Arthritis    Basal cell carcinoma    GERD (gastroesophageal reflux disease)    Skin cancer    Squamous cell carcinoma of skin     Patient Active Problem List   Diagnosis Date Noted   Varicose veins with inflammation 11/06/2023   Chronic venous insufficiency 11/06/2023   Essential hypertension 11/06/2023   GERD (gastroesophageal reflux disease) 11/06/2023   Lymphedema 11/06/2023   Special screening for malignant neoplasms, colon    Increased BMI 01/12/2016   Menopause 01/12/2016   Urinary urgency 08/10/2015   Nocturia 08/10/2015    Past Surgical History:  Procedure Laterality Date   BREAST EXCISIONAL BIOPSY Left 2000   benign   CESAREAN SECTION  8011,8005   COLONOSCOPY WITH PROPOFOL  N/A 07/04/2017   Procedure: COLONOSCOPY WITH PROPOFOL ;  Surgeon: Jinny Carmine, MD;  Location: [facility] ENDOSCOPY;  Service: Endoscopy;  Laterality: N/A;   DILATION AND CURETTAGE OF UTERUS     FOOT SURGERY  2012   plate placed   FOOT SURGERY Left    October 2023   NASAL SEPTUM SURGERY  1975   TUBAL LIGATION      OB History     Gravida  3   Para  2   Term  2   Preterm       AB  1   Living  2      SAB  1   IAB      Ectopic      Multiple      Live Births  2            Home Medications    Prior to Admission medications   Medication Sig Start Date End Date Taking? Authorizing Provider  azithromycin  (ZITHROMAX ) 250 MG tablet Take 1 tablet (250 mg total) by mouth daily. Take first 2 tablets together, then 1 every day until finished. 11/05/23   Ermine Spofford, Shelba SAUNDERS, NP  diclofenac  (VOLTAREN ) 75 MG EC tablet TAKE 1 TABLET BY MOUTH TWICE A DAY 11/08/23   Hyatt, Max T, DPM  losartan (COZAAR) 25 MG tablet TAKE 1 TABLET(25 MG) BY MOUTH AT BEDTIME 07/15/22   [provider]  magic mouthwash (nystatin , lidocaine , diphenhydrAMINE, alum & mag hydroxide) suspension Swish and spit 5 mLs 4 (four) times daily as needed for mouth pain. 10/30/23   Teresa Shelba SAUNDERS, NP  mupirocin  ointment (BACTROBAN ) 2 % Apply 1 Application topically daily. Patient not taking: Reported on 08/31/2023 11/03/22   Silva Juliene SAUNDERS, DPM  omeprazole (PRILOSEC) 40 MG capsule TK 1 C PO D 05/23/19   [provider]  Turmeric (QC TUMERIC COMPLEX PO) Take by mouth.    [provider]    Family History Family History  Problem Relation Age of Onset   Hypertension Father    Bladder Cancer Neg Hx    Kidney cancer Neg Hx    Prostate cancer Neg Hx    Cancer Neg Hx    Diabetes Neg Hx    Heart disease Neg Hx    Ovarian cancer Neg Hx    Breast cancer Neg Hx     Social History Social History   Tobacco Use   Smoking status: Never   Smokeless tobacco: Never  Vaping Use   Vaping status: Never Used  Substance Use Topics   Alcohol use: Yes    Alcohol/week: 0.0 standard drinks of alcohol    Comment: occasionaly   Drug use: No     Allergies   Patient has no known allergies.   Review of Systems Review of Systems   Physical Exam Triage Vital Signs ED Triage Vitals  Encounter Vitals Group     BP 12/12/23 0818 120/78     Systolic BP Percentile --       Diastolic BP Percentile --      Pulse Rate 12/12/23 0818 84     Resp 12/12/23 0818 18     Temp 12/12/23 0818 98.2 F (36.8 C)     Temp Source 12/12/23 0818 Oral     SpO2 12/12/23 0818 96 %     Weight --      Height --      Head Circumference --      Peak Flow --      Pain Score 12/12/23 0820 8     Pain Loc --      Pain Education --      Exclude from Growth Chart --    No data found.  Updated Vital Signs BP 120/78 (BP Location: Right Arm)   Pulse 84   Temp 98.2 F (36.8 C) (Oral)   Resp 18   SpO2 96%   Visual Acuity Right Eye Distance:   Left Eye Distance:   Bilateral Distance:    Right Eye Near:   Left Eye Near:    Bilateral Near:     Physical Exam Constitutional:      Appearance: Normal appearance.  Eyes:     Extraocular Movements: Extraocular movements intact.  Pulmonary:     Effort: Pulmonary effort is normal.  Musculoskeletal:     Comments: Tenderness present to the anterior left ankle and over the medial malleolus with mild swelling, no ecchymosis or deformity, able to bear weight and to complete range of motion but pain is elicited when standing, 2+ dorsalis pedis pulse  Neurological:     Mental Status: She is alert and oriented to person, place, and time.      UC Treatments / Results  Labs (all labs ordered are listed, but only abnormal results are displayed) Labs Reviewed - No data to display  EKG   Radiology No results found.  Procedures Procedures (including critical care time)  Medications Ordered in UC Medications - No data to display  Initial Impression / Assessment and Plan / UC Course  I have reviewed the triage vital signs and the nursing notes.  Pertinent labs & imaging results that were available during my care of the patient were reviewed by me and considered in my medical decision making (see chart for details).  Acute left ankle pain  X-ray pending, will notify via telephone, Ace bandage applied as she has difficulty  applying ankle brace, to be used as needed for stability and support, recommended continued supportive care through [facility] Advised follow-up with her podiatrist if fracture noted Final Clinical Impressions(s) / UC Diagnoses   Final diagnoses:  Acute left ankle pain     Discharge Instructions         X-ray is pending, you will be notified of results via telephone   Ace bandage has been applied to provide stability and support, will need to wear whenever completing activity until symptoms have resolved, may remove at rest   If there is a fracture (broken bone) present then she will need to wear at all times until evaluated by orthopedic specialist and more stabilizing splint or boot applied   , If no fracture is present then she will only need to follow-up if no improvement has been seen  May take ibuprofen and or Tylenol  every 6 hours as needed for pain   May apply ice or heat over the affected area if tolerable   ED Prescriptions   None    PDMP not reviewed this encounter.   Teresa Shelba SAUNDERS, [place] 12/12/23 [phone]

## 2023-12-12 NOTE — ED Triage Notes (Signed)
 Patient presents to UC for left ankle from a fall yesterday. Treating with ibuprofen. Pain worse with ambulation, spoke to her podiatrist who instructed her to come in for eval.

## 2023-12-12 NOTE — Discharge Instructions (Addendum)
  X-ray is pending, you will be notified of results via telephone   Ace bandage has been applied to provide stability and support, will need to wear whenever completing activity until symptoms have resolved, may remove at rest   If there is a fracture (broken bone) present then she will need to wear at all times until evaluated by orthopedic specialist and more stabilizing splint or boot applied   , If no fracture is present then she will only need to follow-up if no improvement has been seen  May take ibuprofen and or Tylenol  every 6 hours as needed for pain   May apply ice or heat over the affected area if tolerable

## 2023-12-12 NOTE — Telephone Encounter (Signed)
 Reported X-ray results via telephone, 2 patient identifiers used.

## 2024-01-22 ENCOUNTER — Ambulatory Visit: Payer: Medicare Other | Admitting: Dermatology

## 2024-01-22 ENCOUNTER — Encounter: Payer: Self-pay | Admitting: Dermatology

## 2024-01-22 DIAGNOSIS — L570 Actinic keratosis: Secondary | ICD-10-CM | POA: Diagnosis not present

## 2024-01-22 DIAGNOSIS — L820 Inflamed seborrheic keratosis: Secondary | ICD-10-CM | POA: Diagnosis not present

## 2024-01-22 DIAGNOSIS — L578 Other skin changes due to chronic exposure to nonionizing radiation: Secondary | ICD-10-CM | POA: Diagnosis not present

## 2024-01-22 DIAGNOSIS — W908XXA Exposure to other nonionizing radiation, initial encounter: Secondary | ICD-10-CM

## 2024-01-22 DIAGNOSIS — Z5111 Encounter for antineoplastic chemotherapy: Secondary | ICD-10-CM | POA: Diagnosis not present

## 2024-01-22 MED ORDER — FLUOROURACIL 5 % EX CREA: TOPICAL_CREAM | Freq: Two times a day (BID) | CUTANEOUS | 2 refills | Status: DC

## 2024-01-22 NOTE — Progress Notes (Unsigned)
 Follow-Up Visit   Subjective  Pamela Jacobs is a 68 y.o. female who presents for the following: spots on R forehead within last 30 days, pt states she previously treated with chemo cream years ago at same area. Area is scaly and one of the spots is at hairline. Pt also has spot at R side of nose that has been treated with LN2 in the past.  The patient has spots, moles and lesions to be evaluated, some may be new or changing and the patient may have concern these could be cancer.   The following portions of the chart were reviewed this encounter and updated as appropriate: medications, allergies, medical history  Review of Systems:  No other skin or systemic complaints except as noted in HPI or Assessment and Plan.  Objective  Well appearing patient in no apparent distress; mood and affect are within normal limits.  A focused examination was performed of the following areas: Face  Relevant exam findings are noted in the Assessment and Plan.  Right Alar Crease Stuck on waxy paps with erythema  Assessment & Plan    ACTINIC DAMAGE WITH PRECANCEROUS ACTINIC KERATOSES Counseling for Topical Chemotherapy Management: Patient exhibits: - Severe, confluent actinic changes with pre-cancerous actinic keratoses that is secondary to cumulative UV radiation exposure over time - Condition that is severe; chronic, not at goal. - diffuse scaly erythematous macules and papules with underlying dyspigmentation - Discussed Prescription "Field Treatment" topical Chemotherapy for Severe, Chronic Confluent Actinic Changes with Pre-Cancerous Actinic Keratoses Field treatment involves treatment of an entire area of skin that has confluent Actinic Changes (Sun/ Ultraviolet light damage) and PreCancerous Actinic Keratoses by method of PhotoDynamic Therapy (PDT) and/or prescription Topical Chemotherapy agents such as 5-fluorouracil, 5-fluorouracil/calcipotriene, and/or imiquimod.  The purpose is to decrease  the number of clinically evident and subclinical PreCancerous lesions to prevent progression to development of skin cancer by chemically destroying early precancer changes that may or may not be visible.  It has been shown to reduce the risk of developing skin cancer in the treated area. As a result of treatment, redness, scaling, crusting, and open sores may occur during treatment course. One or more than one of these methods may be used and may have to be used several times to control, suppress and eliminate the PreCancerous changes. Discussed treatment course, expected reaction, and possible side effects. - Recommend daily broad spectrum sunscreen SPF 30+ to sun-exposed areas, reapply every 2 hours as needed.  - Staying in the shade or wearing long sleeves, sun glasses (UVA+UVB protection) and wide brim hats (4-inch brim around the entire circumference of the hat) are also recommended. - Call for new or changing lesions.   Patient's son is getting married at end of next month 02/24/24, patient will wait to start 5FU cream until afterwards at beginning of April 02/27/24.   - Start 5-fluorouracil/calcipotriene cream twice a day for 4-5 days to affected areas including right temple, right forehead, frontal hair line  until reaction occurs such as redness or irritation. Prescription sent to [facility]. Patient advised they will receive a phone call to purchase the medication online and have it sent to their home. Patient provided with handout reviewing treatment course and side effects and advised to call or message [place] on MyChart with any concerns.  Reviewed course of treatment and expected reaction.  Patient advised to expect inflammation and crusting and advised that erosions are possible.  Patient advised to be diligent with sun protection during  and after treatment. Counseled to keep medication out of reach of children and pets.   INFLAMED SEBORRHEIC KERATOSIS Right Alar  Crease Discussed treatment with LN2 and may require more than one treatment, will monitor for now.  ACTINIC KERATOSES   ACTINIC ELASTOSIS    Return in about 3 months (around 04/20/2024) for [place] follow-up, w/ Dr. Katrinka Blazing.  Wynonia Lawman, CMA, am acting as scribe for Elie Goody, MD .   Documentation: I have reviewed the above documentation for accuracy and completeness, and I agree with the above.  Elie Goody, MD

## 2024-01-22 NOTE — Patient Instructions (Addendum)
 - Start 5-fluorouracil/calcipotriene cream twice a day for 4-5 days to affected areas including right temple, right forehead, frontal hair line  until reaction occurs such as redness or irritation. Prescription sent to [facility] [address] [place], [place] [address]  Phone: [phone] TOLL-FREE: [phone]   Due to recent changes in healthcare laws, you may see results of your pathology and/or laboratory studies on MyChart before the doctors have had a chance to review them. We understand that in some cases there may be results that are confusing or concerning to you. Please understand that not all results are received at the same time and often the doctors may need to interpret multiple results in order to provide you with the best plan of care or course of treatment. Therefore, we ask that you please give [place] 2 business days to thoroughly review all your results before contacting the office for clarification. Should we see a critical lab result, you will be contacted sooner.   If You Need Anything After Your Visit  If you have any questions or concerns for your doctor, please call our main line at [phone] and press option 4 to reach your doctor's medical assistant. If no one answers, please leave a voicemail as directed and we will return your call as soon as possible. Messages left after 4 pm will be answered the following business day.   You may also send [place] a message via MyChart. We typically respond to MyChart messages within 1-2 business days.  For prescription refills, please ask your pharmacy to contact our office. Our fax number is [phone].  If you have an urgent issue when the clinic is closed that cannot wait until the next business day, you can page your doctor at the number below.    Please note that while we do our best to be available for urgent issues outside of office hours, we are not available 24/7.   If you have  an urgent issue and are unable to reach [place], you may choose to seek medical care at your doctor's office, retail clinic, urgent care center, or emergency room.  If you have a medical emergency, please immediately call 911 or go to the emergency department.  Pager Numbers  - Dr. Gwen Pounds: [phone]  - Dr. Roseanne Reno: [phone]  - Dr. Katrinka Blazing: [phone]   In the event of inclement weather, please call our main line at [phone] for an update on the status of any delays or closures.  Dermatology Medication Tips: Please keep the boxes that topical medications come in in order to help keep track of the instructions about where and how to use these. Pharmacies typically print the medication instructions only on the boxes and not directly on the medication tubes.   If your medication is too expensive, please contact our office at [phone] option 4 or send [place] a message through MyChart.   We are unable to tell what your co-pay for medications will be in advance as this is different depending on your insurance coverage. However, we may be able to find a substitute medication at lower cost or fill out paperwork to get insurance to cover a needed medication.   If a prior authorization is required to get your medication covered by your insurance company, please allow [place] 1-2 business days to complete this process.  Drug prices often vary depending on where the prescription is filled and some pharmacies may offer cheaper prices.  The website www.goodrx.com contains coupons for medications  through different pharmacies. The prices here do not account for what the cost may be with help from insurance (it may be cheaper with your insurance), but the website can give you the price if you did not use any insurance.  - You can print the associated coupon and take it with your prescription to the pharmacy.  - You may also stop by our office during regular business hours and pick up a GoodRx coupon  card.  - If you need your prescription sent electronically to a different pharmacy, notify our office through [facility] or by phone at [phone] option 4.     Si Usted Necesita Algo Despus de Su Visita  Tambin puede enviarnos un mensaje a travs de Clinical cytogeneticist. Por lo general respondemos a los mensajes de MyChart en el transcurso de 1 a 2 das hbiles.  Para renovar recetas, por favor pida a su farmacia que se ponga en contacto con nuestra oficina. Annie Sable de fax es [place] [phone].  Si tiene un asunto urgente cuando la clnica est cerrada y que no puede esperar hasta el siguiente da hbil, puede llamar/localizar a su doctor(a) al nmero que aparece a continuacin.   Por favor, tenga en cuenta que aunque hacemos todo lo posible para estar disponibles para asuntos urgentes fuera del horario de [place], no estamos disponibles las 24 horas del da, los 7 [address]  [address] de la [place].   Si tiene un problema urgente y no puede comunicarse con nosotros, puede optar por buscar atencin mdica  en el consultorio de su doctor(a), en una clnica privada, en un centro de atencin urgente o en una sala de emergencias.  Si tiene Engineer, drilling, por favor llame inmediatamente al 911 o vaya a la sala de emergencias.  Nmeros de bper  - Dr. Gwen Pounds: [phone]  - Dra. Roseanne Reno: [phone]  - Dr. Katrinka Blazing: [phone]   En caso de inclemencias del tiempo, por favor llame a Lacy Duverney principal al [phone] para una actualizacin sobre el [place] de cualquier retraso o cierre.  Consejos para la medicacin en dermatologa: Por favor, guarde las cajas en las que vienen los medicamentos de uso tpico para ayudarle a seguir las instrucciones sobre dnde y cmo usarlos. Las farmacias generalmente imprimen las instrucciones del medicamento slo en las cajas y no directamente en los tubos del [place].   Si su medicamento es muy caro, por favor, pngase en contacto con Rolm Gala llamando al [phone] y presione la opcin 4 o envenos un mensaje a travs de Clinical cytogeneticist.   No podemos decirle cul ser su copago por los medicamentos por adelantado ya que esto es diferente dependiendo de la cobertura de su seguro. Sin embargo, es posible que podamos encontrar un medicamento sustituto a Audiological scientist un formulario para que el seguro cubra el medicamento que se considera necesario.   Si se requiere una autorizacin previa para que su compaa de seguros [place] su medicamento, por favor permtanos de 1 a 2 das hbiles para completar [address]  Los precios de los medicamentos varan con frecuencia dependiendo del Environmental consultant de dnde se surte la receta y alguna farmacias pueden ofrecer precios ms baratos.  El sitio web www.goodrx.com tiene cupones para medicamentos de Health and safety inspector. Los precios aqu no tienen en cuenta lo que podra costar con la ayuda del seguro (puede ser ms barato con su seguro), pero el sitio web puede darle el precio si no utiliz Tourist information centre manager.  - Puede imprimir el cupn correspondiente y  llevarlo con su receta a la farmacia.  - Tambin puede pasar por nuestra oficina durante el horario de atencin regular y Education officer, museum una tarjeta de cupones de GoodRx.  - Si necesita que su receta se enve electrnicamente a una farmacia diferente, informe a nuestra oficina a travs de MyChart de [place] o por telfono llamando al [phone] y presione la opcin 4.

## 2024-02-23 IMAGING — MR MR ANKLE*L* W/O CM
4 of 5 series · 12 of 40 positions shown · non-contrast
Comparison: None Available.

CLINICAL DATA: Ankle pain.

EXAM:
MRI OF THE LEFT ANKLE WITHOUT CONTRAST
TECHNIQUE: Multiplanar, multisequence MR imaging of the ankle was performed. No
intravenous contrast was administered.

[Series 3: PD fat-sat · axial · left · 3.0mm · 0.25mm/px · z∈[-77,+11]mm · 3 of 30 slices shown]
[im 4/30]
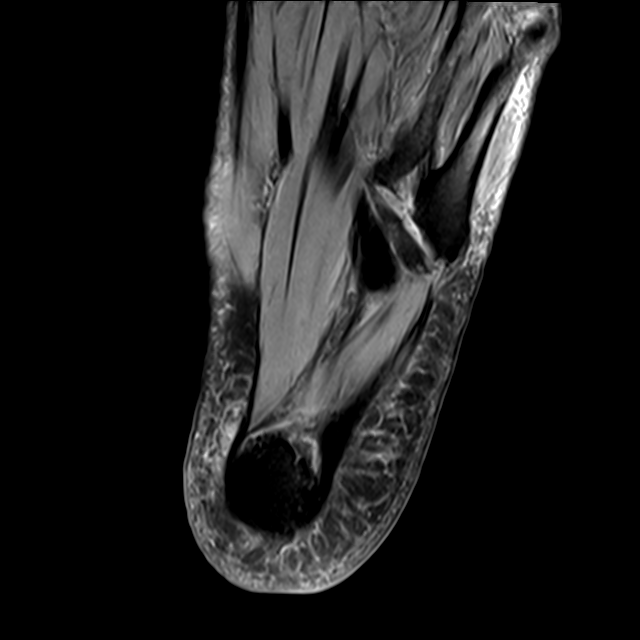
[im 17/30]
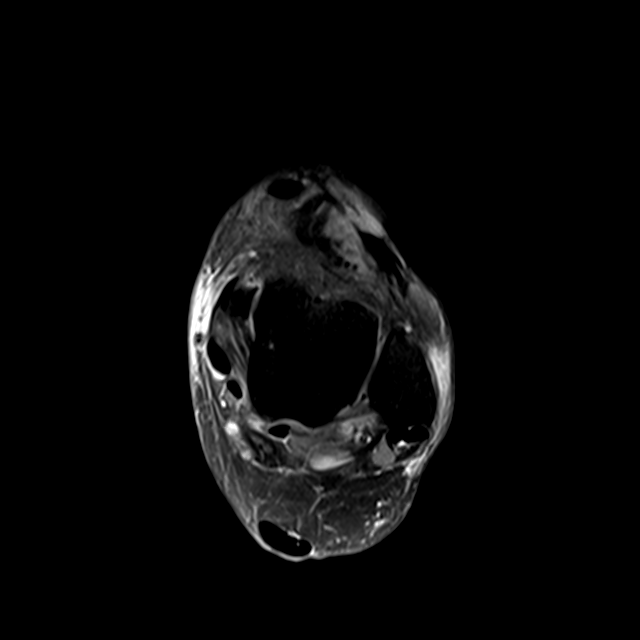
[im 26/30]
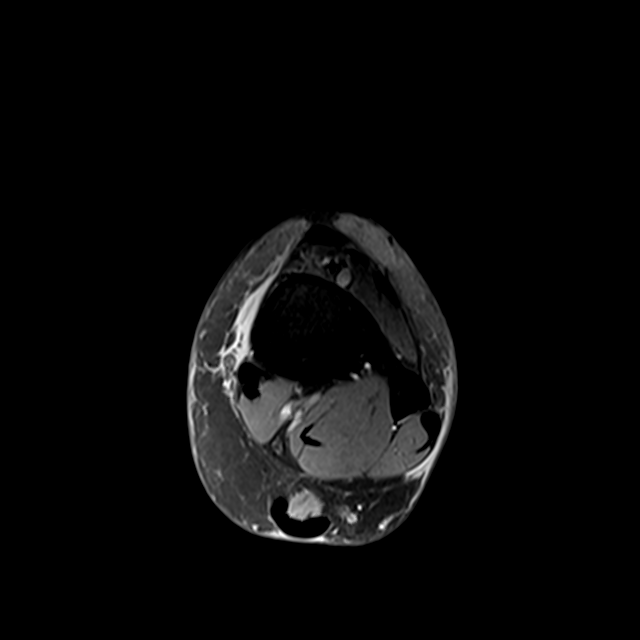

[Series 4: T2 fat-sat · axial · left · 3.0mm · 0.25mm/px · z∈[-77,+11]mm · 3 of 30 slices shown (1 of 2)]
[im 4/30]
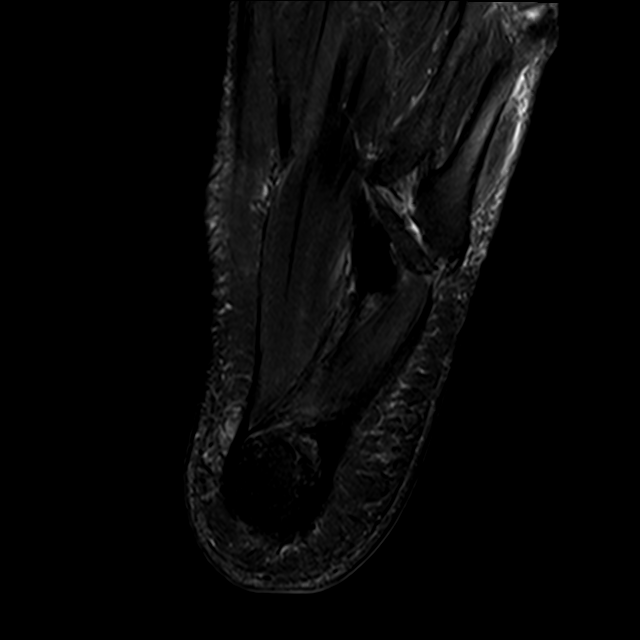
[im 15/30]
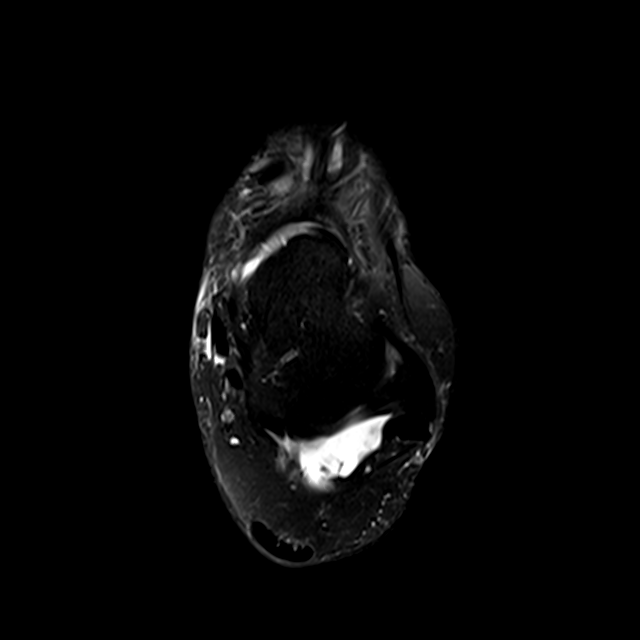
[im 26/30]
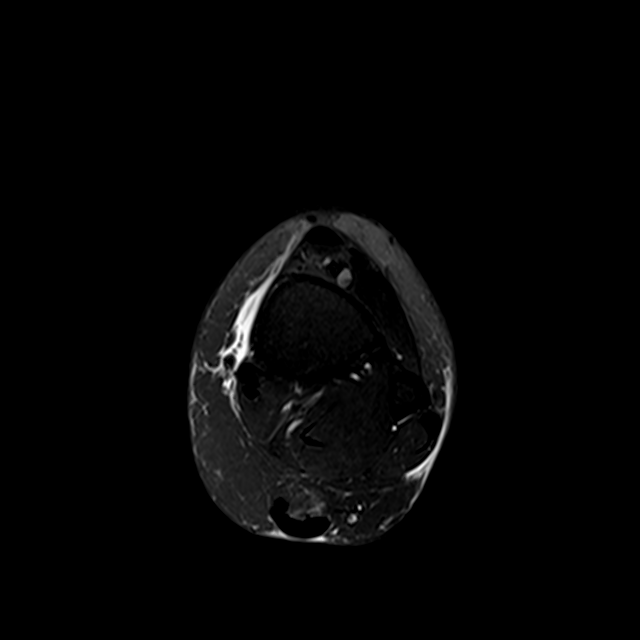

[Series 5: T1 · sagittal · left · 4.0mm · 0.27mm/px · 3 of 18 slices shown]
[im 1/18]
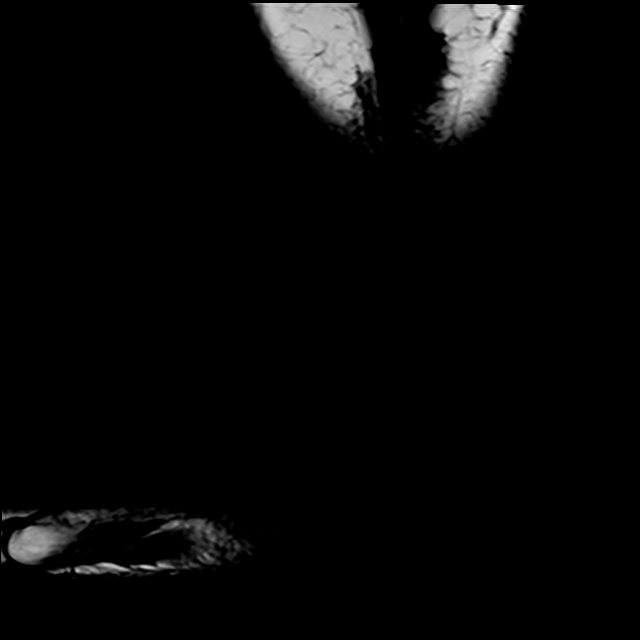
[im 9/18]
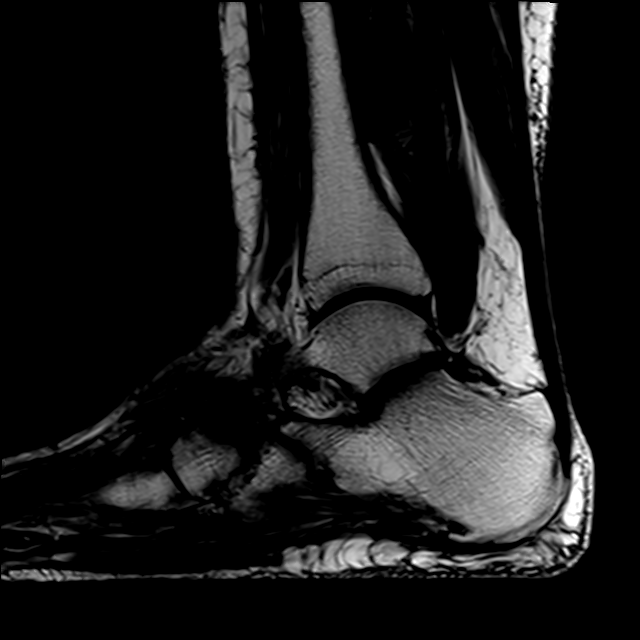
[im 18/18]
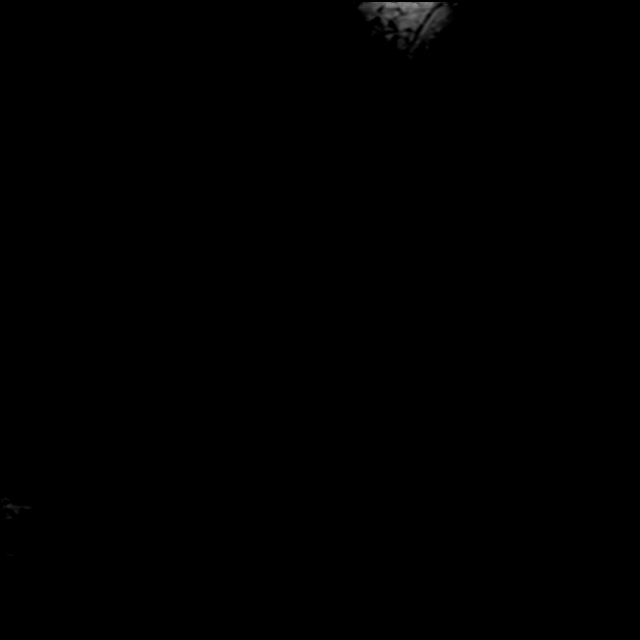

[Series 7: T2 fat-sat · coronal · left · 3.0mm · 0.25mm/px · 3 of 35 slices shown (2 of 2)]
[im 4/35]
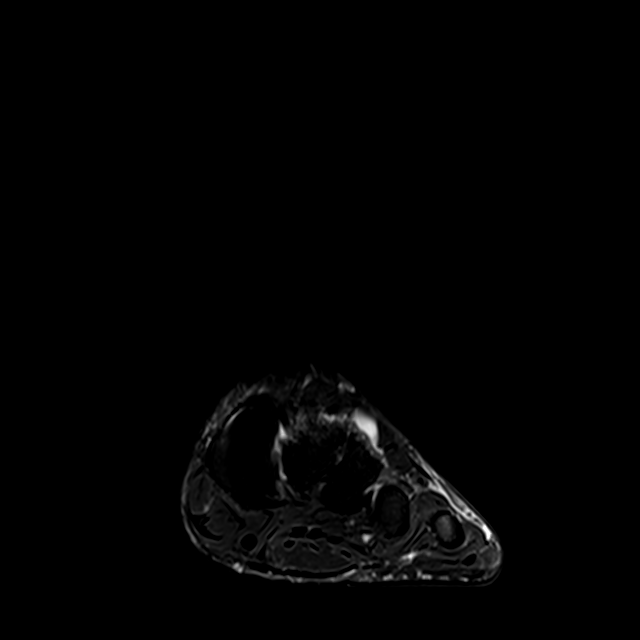
[im 18/35]
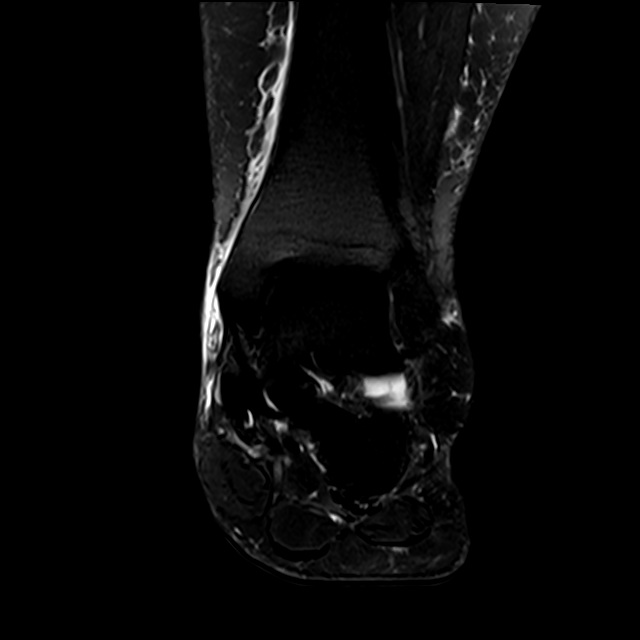
[im 31/35]
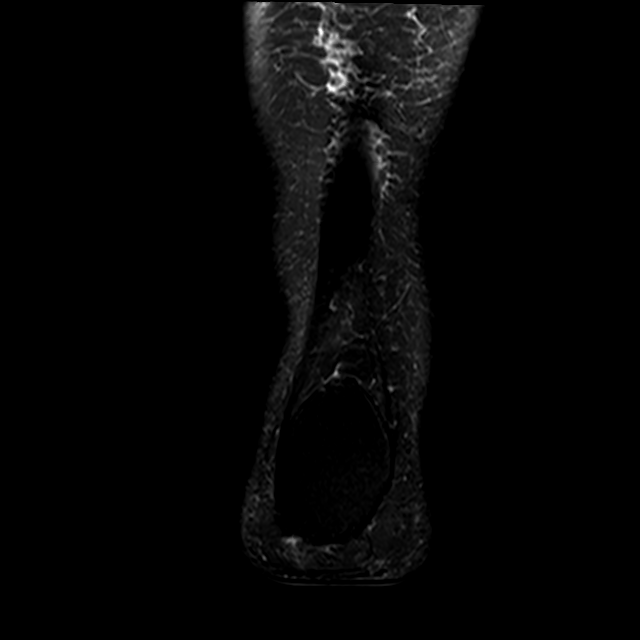

[12 of 40 positions shown; findings below may reference images not displayed]

FINDINGS: TENDONS

Peroneal: Peroneal longus tendon intact. Mild tendinosis of the
peroneus brevis with a short-segment longitudinal split tear. Mild
peroneal tenosynovitis.

Posteromedial: Posterior tibial tendon intact. Flexor hallucis
longus tendon intact. Flexor digitorum longus tendon intact. Small
amount of fluid in the posterior tibial tendon sheath likely
reflecting mild tenosynovitis.

Anterior: Tibialis anterior tendon intact. Extensor hallucis longus
tendon intact Extensor digitorum longus tendon intact.

Achilles:  Intact.

Plantar Fascia: Intact.

LIGAMENTS

Lateral: Anterior talofibular ligament intact. Calcaneofibular
ligament intact. Posterior talofibular ligament intact. Anterior and
posterior tibiofibular ligaments intact.

Medial: Deltoid ligament intact. Spring ligament intact.

CARTILAGE

Ankle Joint: No joint effusion. 5 mm osteochondral lesion involving
the medial corner of the talar dome with subchondral marrow edema
and cystic changes and overlying cartilage fissuring.

Subtalar Joints/Sinus Tarsi: Normal subtalar joints. No subtalar
joint effusion. Normal sinus tarsi.

Bones: No aggressive osseous lesion. No fracture or dislocation.
Severe osteoarthritis with subchondral marrow edema involving the
navicular-medial cuneiform, middle cuneiform, lateral cuneiform most
severe at the navicular-lateral cuneiform articulation. Severe
osteoarthritis of the second tarsometatarsal joint. Mild
osteoarthritis of the fifth tarsometatarsal joint. Moderate
osteoarthritis of the third tarsometatarsal joint.

Soft Tissue: No fluid collection or hematoma. Muscles are normal
without edema or atrophy. Tarsal tunnel is normal. 10 mm ganglion
cyst along the dorsal aspect of the third TMT joint.
IMPRESSION: 1. Mild tendinosis of the peroneus brevis with a short-segment
longitudinal split tear. Mild peroneal tenosynovitis.
2. Severe osteoarthritis with subchondral marrow edema involving the
navicular-medial cuneiform, middle cuneiform, lateral cuneiform most
severe at the navicular-lateral cuneiform articulation.
3. Severe osteoarthritis of the second tarsometatarsal joint.
4. Mild osteoarthritis of the fifth tarsometatarsal joint. Moderate
osteoarthritis of the third tarsometatarsal joint.
5. A 5 mm osteochondral lesion involving the medial corner of the
talar dome with subchondral marrow edema and cystic changes and
overlying cartilage fissuring.

## 2024-02-27 ENCOUNTER — Other Ambulatory Visit: Payer: Self-pay | Admitting: Dermatology

## 2024-02-27 ENCOUNTER — Encounter: Payer: Self-pay | Admitting: Dermatology

## 2024-02-27 DIAGNOSIS — L570 Actinic keratosis: Secondary | ICD-10-CM

## 2024-02-27 DIAGNOSIS — L578 Other skin changes due to chronic exposure to nonionizing radiation: Secondary | ICD-10-CM

## 2024-02-27 MED ORDER — FLUOROURACIL 5 % EX CREA: TOPICAL_CREAM | CUTANEOUS | 2 refills | Status: DC

## 2024-03-12 ENCOUNTER — Telehealth: Payer: Self-pay | Admitting: Podiatry

## 2024-03-12 NOTE — Telephone Encounter (Signed)
 Pt states she has had orthotics done some years back. She will like to speak with you to discuss pricing and possible have a new pair made.

## 2024-03-20 ENCOUNTER — Ambulatory Visit: Admitting: Obstetrics and Gynecology

## 2024-03-20 ENCOUNTER — Encounter: Payer: Self-pay | Admitting: Obstetrics and Gynecology

## 2024-03-20 VITALS — BP 149/82 | HR 62 | Ht 64.0 in | Wt 209.1 lb

## 2024-03-20 DIAGNOSIS — N951 Menopausal and female climacteric states: Secondary | ICD-10-CM

## 2024-03-20 DIAGNOSIS — R232 Flushing: Secondary | ICD-10-CM

## 2024-03-20 MED ORDER — ESTRADIOL 0.5 MG PO TABS: 1.0000 mg | ORAL_TABLET | Freq: Every day | ORAL | 3 refills | Status: AC

## 2024-03-20 MED ORDER — ESTRADIOL-NORETHINDRONE ACET 1-0.5 MG PO TABS: 1.0000 | ORAL_TABLET | Freq: Every day | ORAL | 3 refills | Status: DC

## 2024-03-20 MED ORDER — MEDROXYPROGESTERONE ACETATE 2.5 MG PO TABS: 2.5000 mg | ORAL_TABLET | Freq: Every day | ORAL | 3 refills | Status: AC

## 2024-03-20 NOTE — Progress Notes (Signed)
 HPI:      [name] is a 68 y.o. [id number] who LMP was No LMP recorded. Patient is postmenopausal.  Subjective:   She presents today because her primary care doctor took her off Activella approximately 2 years ago.  Since that time she has been having disruptive hot flashes and night sweats.  It is directly affecting her lifestyle.  She has tried black cohosh and over-the-counter remedies without success.  She presents today to discuss the possibility of restarting HRT.    Hx: The following portions of the patient's history were reviewed and updated as appropriate:             She  has a past medical history of Arthritis, Basal cell carcinoma, GERD (gastroesophageal reflux disease), Skin cancer, and Squamous cell carcinoma of skin. She does not have any pertinent problems on file. She  has a past surgical history that includes Nasal septum surgery (1975); Cesarean section (9528,4132); Foot surgery (2012); Dilation and curettage of uterus; Tubal ligation; Colonoscopy with propofol  (N/A, 07/04/2017); Breast excisional biopsy (Left, 2000); and Foot surgery (Left). Her family history includes Hypertension in her father. She  reports that she has never smoked. She has never used smokeless tobacco. She reports current alcohol use. She reports that she does not use drugs. She has a current medication list which includes the following prescription(s): estradiol -norethindrone , fluorouracil , losartan, omeprazole, and turmeric. She has no known allergies.       Review of Systems:  Review of Systems  Constitutional: Denied constitutional symptoms, night sweats, recent illness, fatigue, fever, insomnia and weight loss.  Eyes: Denied eye symptoms, eye pain, photophobia, vision change and visual disturbance.  Ears/Nose/Throat/Neck: Denied ear, nose, throat or neck symptoms, hearing loss, nasal discharge, sinus congestion and sore throat.  Cardiovascular: Denied cardiovascular symptoms, arrhythmia,  chest pain/pressure, edema, exercise intolerance, orthopnea and palpitations.  Respiratory: Denied pulmonary symptoms, asthma, pleuritic pain, productive sputum, cough, dyspnea and wheezing.  Gastrointestinal: Denied, gastro-esophageal reflux, melena, nausea and vomiting.  Genitourinary: Denied genitourinary symptoms including symptomatic vaginal discharge, pelvic relaxation issues, and urinary complaints.  Musculoskeletal: Denied musculoskeletal symptoms, stiffness, swelling, muscle weakness and myalgia.  Dermatologic: Denied dermatology symptoms, rash and scar.  Neurologic: Denied neurology symptoms, dizziness, headache, neck pain and syncope.  Psychiatric: Denied psychiatric symptoms, anxiety and depression.  Endocrine: See HPI for additional information.   Meds:   Current Outpatient Medications on File Prior to Visit  Medication Sig Dispense Refill   fluorouracil  (EFUDEX ) 5 % cream Apply to right temple, right forehead, and frontal hair line twice per day until reaction develops then stop. Usually occurs by 5th day. 40 g 2   losartan (COZAAR) 25 MG tablet TAKE 1 TABLET(25 MG) BY MOUTH AT BEDTIME     omeprazole (PRILOSEC) 40 MG capsule TK 1 C PO D     Turmeric (QC TUMERIC COMPLEX PO) Take by mouth.     No current facility-administered medications on file prior to visit.      Objective:     Vitals:   03/20/24 1005 03/20/24 1038  BP: (!) 159/84 (!) 149/82  Pulse: 62    Filed Weights   03/20/24 1005  Weight: 209 lb 1.6 oz (94.8 kg)                        Assessment:    [id number] Patient Active Problem List   Diagnosis Date Noted   Varicose veins with inflammation 11/06/2023   Chronic venous insufficiency  11/06/2023   Essential hypertension 11/06/2023   GERD (gastroesophageal reflux disease) 11/06/2023   Lymphedema 11/06/2023   Special screening for malignant neoplasms, colon    Increased BMI 01/12/2016   Menopause 01/12/2016   Urinary urgency 08/10/2015   Nocturia  08/10/2015     1. Menopausal symptoms   2. Hot flashes        Plan:            1.  We have discussed the current thought process regarding use of HRT and the increased risk of starting it this late after menopause.  Her case is somewhat exceptional and that she was on HRT for a while and has now only been off of it for 2 years.  The disruptive nature of her hot flashes is also a factor in whether or not to restart it. She understands there is a risk.  For a restart on the order of approximately 3 per thousand for stroke and heart disease.  The WHI study was directly discussed in the stratification of postmenopausal women based on age was discussed.  Current recommendations regarding late start of HRT and menopause was discussed. Despite these risks she thinks that she was much happier when she was on Activella and was not having disruptive hot flashes or night sweats.  She would like to restart Activella. Orders No orders of the defined types were placed in this encounter.    Meds ordered this encounter  Medications   estradiol -norethindrone  (ACTIVELLA) 1-0.5 MG tablet    Sig: Take 1 tablet by mouth daily.    Dispense:  90 tablet    Refill:  3      F/U  No follow-ups on file.  Delice Felt, M.D. 03/20/2024 10:44 AM

## 2024-03-20 NOTE — Progress Notes (Signed)
 Patient presents today to discuss hot flashes. She states a history of activella use but stopped two years ago when her PCP took her off of it.

## 2024-03-20 NOTE — Addendum Note (Signed)
 Addended by: Woody Heading R on: 03/20/2024 11:45 AM   Modules accepted: Orders

## 2024-04-16 ENCOUNTER — Encounter (INDEPENDENT_AMBULATORY_CARE_PROVIDER_SITE_OTHER): Payer: Self-pay

## 2024-04-24 ENCOUNTER — Ambulatory Visit: Payer: Medicare Other | Admitting: Dermatology

## 2024-04-29 ENCOUNTER — Ambulatory Visit: Admitting: Dermatology

## 2024-04-29 ENCOUNTER — Encounter: Payer: Self-pay | Admitting: Dermatology

## 2024-04-29 DIAGNOSIS — W908XXD Exposure to other nonionizing radiation, subsequent encounter: Secondary | ICD-10-CM

## 2024-04-29 DIAGNOSIS — D492 Neoplasm of unspecified behavior of bone, soft tissue, and skin: Secondary | ICD-10-CM | POA: Diagnosis not present

## 2024-04-29 DIAGNOSIS — L570 Actinic keratosis: Secondary | ICD-10-CM | POA: Diagnosis not present

## 2024-04-29 DIAGNOSIS — L578 Other skin changes due to chronic exposure to nonionizing radiation: Secondary | ICD-10-CM | POA: Diagnosis not present

## 2024-04-29 NOTE — Progress Notes (Signed)
   Follow-Up Visit   Subjective  MAREN Jacobs is a 68 y.o. female who presents for the following: AK f/u. Patient treated with 5FU cream to  right temple, right forehead, and frontal hair line. Patient reports most of them came off and had a good reaction but feels right temple did not come off very well.   The patient has spots, moles and lesions to be evaluated, some may be new or changing and the patient may have concern these could be cancer.   The following portions of the chart were reviewed this encounter and updated as appropriate: medications, allergies, medical history  Review of Systems:  No other skin or systemic complaints except as noted in HPI or Assessment and Plan.  Objective  Well appearing patient in no apparent distress; mood and affect are within normal limits.  A focused examination was performed of the following areas: Face  Relevant exam findings are noted in the Assessment and Plan.  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 - these may recur and new lesions may form requiring treatment to prevent transformation into skin cancer - observe for new or changing spots and contact [facility] for appointment if occur - photoprotection with sun protective clothing; sunglasses and broad spectrum sunscreen with SPF of at least 30 + and frequent self skin exams recommended - yearly exams by a dermatologist recommended for persons with history of PreCancerous Actinic Keratoses    NEOPLASM OF SKIN Right Temple Skin / nail biopsy Type of biopsy: tangential   Informed consent: discussed and consent obtained   Timeout: patient name, date of birth, surgical site, and procedure verified   Procedure prep:  Patient was prepped and draped in usual sterile fashion Prep type:   Isopropyl alcohol Anesthesia: the lesion was anesthetized in a standard fashion   Anesthetic:  1% lidocaine  w/ epinephrine 1-100,000 buffered w/ 8.4% NaHCO3 Instrument used: DermaBlade   Hemostasis achieved with: pressure and aluminum chloride   Outcome: patient tolerated procedure well   Post-procedure details: sterile dressing applied and wound care instructions given   Dressing type: bandage and petrolatum   Specimen 1 - Surgical pathology Differential Diagnosis: AK vs SCC  Check Margins: No 5mm pink scaly papule Discussed treatment with LN2 or biopsy today, patient prefers to go ahead and proceed with biopsy today.  ACTINIC KERATOSES   Related Medications fluorouracil  (EFUDEX ) 5 % cream Apply to right temple, right forehead, and frontal hair line twice per day until reaction develops then stop. Usually occurs by 5th day. ACTINIC ELASTOSIS   Related Medications fluorouracil  (EFUDEX ) 5 % cream Apply to right temple, right forehead, and frontal hair line twice per day until reaction develops then stop. Usually occurs by 5th day.  Return for As scheduled, w/ Dr. Bary Likes, TBSE, HxSCC, HxBCC.  I, Jacquelynn V. Grier Leber, CMA, am acting as scribe for Harris Liming, MD .   Documentation: I have reviewed the above documentation for accuracy and completeness, and I agree with the above.  Harris Liming, MD

## 2024-04-29 NOTE — Patient Instructions (Addendum)

## 2024-05-01 ENCOUNTER — Ambulatory Visit: Payer: Self-pay | Admitting: Dermatology

## 2024-05-01 LAB — SURGICAL PATHOLOGY

## 2024-05-06 ENCOUNTER — Ambulatory Visit (INDEPENDENT_AMBULATORY_CARE_PROVIDER_SITE_OTHER): Payer: Medicare HMO | Admitting: Vascular Surgery

## 2024-05-06 ENCOUNTER — Encounter (INDEPENDENT_AMBULATORY_CARE_PROVIDER_SITE_OTHER): Payer: Self-pay | Admitting: Vascular Surgery

## 2024-05-06 VITALS — BP 119/79 | HR 78 | Resp 20 | Ht 64.0 in | Wt 207.0 lb

## 2024-05-06 DIAGNOSIS — I890 Lymphedema, not elsewhere classified: Secondary | ICD-10-CM

## 2024-05-06 DIAGNOSIS — I872 Venous insufficiency (chronic) (peripheral): Secondary | ICD-10-CM

## 2024-05-06 DIAGNOSIS — I10 Essential (primary) hypertension: Secondary | ICD-10-CM

## 2024-05-06 DIAGNOSIS — K219 Gastro-esophageal reflux disease without esophagitis: Secondary | ICD-10-CM | POA: Diagnosis not present

## 2024-05-06 NOTE — Progress Notes (Unsigned)
 MRN : [id number]  Pamela Jacobs is a 68 y.o. (1956/01/09) female who presents with chief complaint of legs swell.  History of Present Illness:   The patient returns to the office for followup evaluation regarding leg swelling.  The swelling has persisted but with the lymph pump is under much, much better controlled. The pain associated with swelling is decreased. There have not been any interval development of a ulcerations or wounds.  The patient denies problems with the pump, noting it is working well and the leggings are in good condition.  Since the previous visit the patient has been wearing graduated compression stockings and using the lymph pump on a routine basis and  has noted significant improvement in the lymphedema.   Patient stated the lymph pump has been helpful with the treatment of the lymphedema.   Current Meds  Medication Sig   estradiol  (ESTRACE ) 0.5 MG tablet Take 2 tablets (1 mg total) by mouth daily.   estradiol -norethindrone  (ACTIVELLA) 1-0.5 MG tablet Take 1 tablet by mouth daily.   losartan (COZAAR) 25 MG tablet TAKE 1 TABLET(25 MG) BY MOUTH AT BEDTIME   medroxyPROGESTERone  (PROVERA ) 2.5 MG tablet Take 1 tablet (2.5 mg total) by mouth daily.   omeprazole (PRILOSEC) 40 MG capsule TK 1 C PO D   Turmeric (QC TUMERIC COMPLEX PO) Take by mouth.    Past Medical History:  Diagnosis Date   Arthritis    Basal cell carcinoma    GERD (gastroesophageal reflux disease)    Skin cancer    Squamous cell carcinoma of skin     Past Surgical History:  Procedure Laterality Date   BREAST EXCISIONAL BIOPSY Left 2000   benign   CESAREAN SECTION  4098,1191   COLONOSCOPY WITH PROPOFOL  N/A 07/04/2017   Procedure: COLONOSCOPY WITH PROPOFOL ;  Surgeon: Marnee Sink, MD;  Location: [facility] ENDOSCOPY;  Service: Endoscopy;  Laterality: N/A;   DILATION AND CURETTAGE OF UTERUS     FOOT SURGERY  2012   plate placed   FOOT SURGERY Left    October 2023    NASAL SEPTUM SURGERY  1975   TUBAL LIGATION      Social History Social History   Tobacco Use   Smoking status: Never   Smokeless tobacco: Never  Vaping Use   Vaping status: Never Used  Substance Use Topics   Alcohol use: Yes    Alcohol/week: 0.0 standard drinks of alcohol    Comment: occasionaly   Drug use: No    Family History Family History  Problem Relation Age of Onset   Hypertension Father    Bladder Cancer Neg Hx    Kidney cancer Neg Hx    Prostate cancer Neg Hx    Cancer Neg Hx    Diabetes Neg Hx    Heart disease Neg Hx    Ovarian cancer Neg Hx    Breast cancer Neg Hx     No Known Allergies   REVIEW OF SYSTEMS (Negative unless checked)  Constitutional: [] Weight loss  [] Fever  [] Chills Cardiac: [] Chest pain   [] Chest pressure   [] Palpitations   [] Shortness of breath when laying flat   [] Shortness of breath with exertion. Vascular:  [] Pain in legs with walking   [x] Pain in legs with standing  [] History of DVT   [] Phlebitis   [x] Swelling in legs   []   Varicose veins   [] Non-healing ulcers Pulmonary:   [] Uses home oxygen   [] Productive cough   [] Hemoptysis   [] Wheeze  [] COPD   [] Asthma Neurologic:  [] Dizziness   [] Seizures   [] History of stroke   [] History of TIA  [] Aphasia   [] Vissual changes   [] Weakness or numbness in arm   [] Weakness or numbness in leg Musculoskeletal:   [] Joint swelling   [] Joint pain   [] Low back pain Hematologic:  [] Easy bruising  [] Easy bleeding   [] Hypercoagulable state   [] Anemic Gastrointestinal:  [] Diarrhea   [] Vomiting  [x] Gastroesophageal reflux/heartburn   [] Difficulty swallowing. Genitourinary:  [] Chronic kidney disease   [] Difficult urination  [] Frequent urination   [] Blood in urine Skin:  [] Rashes   [] Ulcers  Psychological:  [] History of anxiety   []  History of major depression.  Physical Examination  Vitals:   05/06/24 1104  BP: 119/79  Pulse: 78  Resp: 20  Weight: 207 lb (93.9 kg)  Height: 5' 4 (1.626 m)   Body mass  index is 35.53 kg/m. Gen: WD/WN, NAD Head: [place]/AT, No temporalis wasting.  Ear/Nose/Throat: Hearing grossly intact, nares w/o erythema or drainage, pinna without lesions Eyes: PER, EOMI, sclera nonicteric.  Neck: Supple, no gross masses.  No JVD.  Pulmonary:  Good air movement, no audible wheezing, no use of accessory muscles.  Cardiac: RRR, precordium not hyperdynamic. Vascular:  scattered varicosities present bilaterally.  Mild venous stasis changes to the legs bilaterally.  2+ soft pitting edema, CEAP [id number]  Vessel Right Left  Radial Palpable Palpable  Gastrointestinal: soft, non-distended. No guarding/no peritoneal signs.  Musculoskeletal: M/S 5/5 throughout.  No deformity.  Neurologic: CN 2-12 intact. Pain and light touch intact in extremities.  Symmetrical.  Speech is fluent. Motor exam as listed above. Psychiatric: Judgment intact, Mood & affect appropriate for pt's clinical situation. Dermatologic: Venous rashes no ulcers noted.  No changes consistent with cellulitis. Lymph : No lichenification or skin changes of chronic lymphedema.  CBC Lab Results  Component Value Date   WBC 8.0 01/02/2014   HGB 13.8 01/02/2014   HCT 40.2 01/02/2014   MCV 96 01/02/2014   PLT 188 01/02/2014    BMET    Component Value Date/Time   NA 135 (L) 01/02/2014 0931   K 3.8 01/02/2014 0931   CL 107 01/02/2014 0931   CO2 26 01/02/2014 0931   GLUCOSE 89 01/02/2014 0931   BUN 15 01/02/2014 0931   CREATININE 0.70 01/02/2014 0931   CALCIUM 9.2 01/02/2014 0931   GFRNONAA >60 01/02/2014 0931   GFRAA >60 01/02/2014 0931   CrCl cannot be calculated (Patient's most recent lab result is older than the maximum 21 days allowed.).  COAG No results found for: INR, PROTIME  Radiology No results found.   Assessment/Plan 1. Lymphedema (Primary) Recommend:  No surgery or intervention at this point in time.    I have reviewed my discussion with the patient regarding lymphedema and why it   causes symptoms.  Patient will continue wearing graduated compression on a daily basis. The patient should put the compression on first thing in the morning and removing them in the evening. The patient should not sleep in the compression.   In addition, behavioral modification throughout the day will be continued.  This will include frequent elevation (such as in a recliner), use of over the counter pain medications as needed and exercise such as walking.  The systemic causes for chronic edema such as liver, kidney and cardiac etiologies does not appear  to have significant changed over the past year.    The patient will continue aggressive use of the  lymph pump.  This will continue to improve the edema control and prevent sequela such as ulcers and infections.   The patient will follow-up with me on an annual basis.   2. Chronic venous insufficiency Recommend:  No surgery or intervention at this point in time.    I have reviewed my discussion with the patient regarding lymphedema and why it  causes symptoms.  Patient will continue wearing graduated compression on a daily basis. The patient should put the compression on first thing in the morning and removing them in the evening. The patient should not sleep in the compression.   In addition, behavioral modification throughout the day will be continued.  This will include frequent elevation (such as in a recliner), use of over the counter pain medications as needed and exercise such as walking.  The systemic causes for chronic edema such as liver, kidney and cardiac etiologies does not appear to have significant changed over the past year.    The patient will continue aggressive use of the  lymph pump.  This will continue to improve the edema control and prevent sequela such as ulcers and infections.   The patient will follow-up with me on an annual basis.   3. Essential hypertension Continue antihypertensive medications as already  ordered, these medications have been reviewed and there are no changes at this time.  4. Gastroesophageal reflux disease without esophagitis Continue PPI as already ordered, this medication has been reviewed and there are no changes at this time.  Avoidence of caffeine and alcohol  Moderate elevation of the head of the bed      Devon Fogo, MD  05/06/2024 11:12 AM

## 2024-05-08 ENCOUNTER — Encounter (INDEPENDENT_AMBULATORY_CARE_PROVIDER_SITE_OTHER): Payer: Self-pay | Admitting: Vascular Surgery

## 2024-07-11 ENCOUNTER — Ambulatory Visit: Admitting: Podiatry

## 2024-07-11 ENCOUNTER — Encounter: Payer: Self-pay | Admitting: Podiatry

## 2024-07-11 ENCOUNTER — Ambulatory Visit (INDEPENDENT_AMBULATORY_CARE_PROVIDER_SITE_OTHER)

## 2024-07-11 DIAGNOSIS — M7750 Other enthesopathy of unspecified foot: Secondary | ICD-10-CM

## 2024-07-11 DIAGNOSIS — M722 Plantar fascial fibromatosis: Secondary | ICD-10-CM

## 2024-07-11 DIAGNOSIS — M7752 Other enthesopathy of left foot: Secondary | ICD-10-CM

## 2024-07-11 MED ORDER — TRIAMCINOLONE ACETONIDE 10 MG/ML IJ SUSP
10.0000 mg | Freq: Once | INTRAMUSCULAR | Status: AC
  Administered 2024-07-11: 10 mg via INTRA_ARTICULAR

## 2024-07-11 NOTE — Progress Notes (Signed)
 Subjective:   Patient ID: Pamela Jacobs, female   DOB: 68 y.o.   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 Silva that has done reasonably well with a lot of pain associated with ambulation     Plan:  H&P reviewed at great length.  At this point I did dispense night splint properly fitted into the arch to stretch the left foot and take pressure off of it and I went ahead and I did a mid arch injection 3 mg Kenalog  5 mg Xylocaine  and applied sterile dressing.  Patient will be seen back in the night splint was properly fitted into the arch and lower leg and I want her to use it at least twice a day  X-rays indicate fixation is in place moderate depression of the arch no indication of secondary bone healing

## 2024-09-04 ENCOUNTER — Ambulatory Visit: Payer: Medicare HMO | Admitting: Dermatology

## 2024-09-04 ENCOUNTER — Encounter: Payer: Self-pay | Admitting: Dermatology

## 2024-09-04 DIAGNOSIS — D1801 Hemangioma of skin and subcutaneous tissue: Secondary | ICD-10-CM

## 2024-09-04 DIAGNOSIS — Z1283 Encounter for screening for malignant neoplasm of skin: Secondary | ICD-10-CM

## 2024-09-04 DIAGNOSIS — L814 Other melanin hyperpigmentation: Secondary | ICD-10-CM | POA: Diagnosis not present

## 2024-09-04 DIAGNOSIS — L578 Other skin changes due to chronic exposure to nonionizing radiation: Secondary | ICD-10-CM | POA: Diagnosis not present

## 2024-09-04 DIAGNOSIS — L821 Other seborrheic keratosis: Secondary | ICD-10-CM

## 2024-09-04 DIAGNOSIS — D229 Melanocytic nevi, unspecified: Secondary | ICD-10-CM

## 2024-09-04 DIAGNOSIS — Z7189 Other specified counseling: Secondary | ICD-10-CM

## 2024-09-04 DIAGNOSIS — L820 Inflamed seborrheic keratosis: Secondary | ICD-10-CM

## 2024-09-04 DIAGNOSIS — W908XXA Exposure to other nonionizing radiation, initial encounter: Secondary | ICD-10-CM | POA: Diagnosis not present

## 2024-09-04 DIAGNOSIS — Z85828 Personal history of other malignant neoplasm of skin: Secondary | ICD-10-CM

## 2024-09-04 DIAGNOSIS — I781 Nevus, non-neoplastic: Secondary | ICD-10-CM

## 2024-09-04 DIAGNOSIS — Z8589 Personal history of malignant neoplasm of other organs and systems: Secondary | ICD-10-CM

## 2024-09-04 NOTE — Patient Instructions (Addendum)

## 2024-09-04 NOTE — Progress Notes (Signed)
 Follow-Up Visit   Subjective  Pamela Jacobs is a 68 y.o. female who presents for the following: Skin Cancer Screening and Full Body Skin Exam. Hx of BCC, Hx of SCC, Hx of AKs.  The patient presents for Total-Body Skin Exam (TBSE) for skin cancer screening and mole check. The patient has spots, moles and lesions to be evaluated, some may be new or changing and the patient may have concern these could be cancer.  The following portions of the chart were reviewed this encounter and updated as appropriate: medications, allergies, medical history  Review of Systems:  No other skin or systemic complaints except as noted in HPI or Assessment and Plan.  Objective  Well appearing patient in no apparent distress; mood and affect are within normal limits.  A full examination was performed including scalp, head, eyes, ears, nose, lips, neck, chest, axillae, abdomen, back, buttocks, bilateral upper extremities, bilateral lower extremities, hands, feet, fingers, toes, fingernails, and toenails. All findings within normal limits unless otherwise noted below.   Relevant physical exam findings are noted in the Assessment and Plan.  Exam of nails limited by presence of nail polish.      R dorsal forearm x2, L dorsal hand x2, R eyebrow x2, R infra orbital x3 (9) Erythematous keratotic or waxy stuck-on papule or plaque.   Assessment & Plan   SKIN CANCER SCREENING PERFORMED TODAY.  ACTINIC DAMAGE - Chronic condition, secondary to cumulative UV/sun exposure - diffuse scaly erythematous macules with underlying dyspigmentation - Recommend daily broad spectrum sunscreen SPF 30+ to sun-exposed areas, reapply every 2 hours as needed.  - Staying in the shade or wearing long sleeves, sun glasses (UVA+UVB protection) and wide brim hats (4-inch brim around the entire circumference of the hat) are also recommended for sun protection.  - Call for new or changing lesions.  LENTIGINES, SEBORRHEIC  KERATOSES, HEMANGIOMAS - Benign normal skin lesions - Benign-appearing - Call for any changes  MELANOCYTIC NEVI - Tan-brown and/or pink-flesh-colored symmetric macules and papules - Benign appearing on exam today - Observation - Call clinic for new or changing moles - Recommend daily use of broad spectrum spf 30+ sunscreen to sun-exposed areas.   HISTORY OF BASAL CELL CARCINOMA OF THE SKIN - No evidence of recurrence today - Recommend regular full body skin exams - Recommend daily broad spectrum sunscreen SPF 30+ to sun-exposed areas, reapply every 2 hours as needed.  - Call if any new or changing lesions are noted between office visits  HISTORY OF SQUAMOUS CELL CARCINOMA OF THE SKIN - No evidence of recurrence today - No lymphadenopathy - Recommend regular full body skin exams - Recommend daily broad spectrum sunscreen SPF 30+ to sun-exposed areas, reapply every 2 hours as needed.  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 See photo. Bx proven AK at site 04/29/2024 with no sign of recurrence.  Exam: dilated blood vessel at R lateral forehead. Blanchable under diascopy.  Treatment Plan: Benign appearing on exam Call for changes Counseling for BBL / IPL / Laser and Coordination of Care Discussed the treatment option of Broad Band Light (BBL) /Intense Pulsed Light (IPL)/ Laser for skin discoloration, including brown spots and redness.  Typically we recommend at least 1-3 treatment sessions about 5-8 weeks apart for best results.  Cannot have tanned skin when BBL performed, and regular use of sunscreen/photoprotection is advised after the procedure to help maintain results. The  patient's condition may also require maintenance treatments in the future.  The fee for BBL / laser treatments is $350 per treatment session for the whole face.  A fee can be quoted for other parts of the body.  Insurance typically  does not pay for BBL/laser treatments and therefore the fee is an out-of-pocket cost. Recommend prophylactic valtrex treatment. Once scheduled for procedure, will send Rx in prior to patient's appointment.   Hyperkeratotic follicle secondary to insect bite Exam: pink keratotic 2 mm lesion at L lateral lower leg Treatment: Observe for changes.  Benign-appearing.  Observation.  Call clinic for new or changing lesions.  Recommend daily use of broad spectrum spf 30+ sunscreen to sun-exposed areas.   INFLAMED SEBORRHEIC KERATOSIS (9) R dorsal forearm x2, L dorsal hand x2, R eyebrow x2, R infra orbital x3 (9) Symptomatic, irritating, patient would like treated.   RTC 2 months if lesions on face have not resolved.  Destruction of lesion - R dorsal forearm x2, L dorsal hand x2, R eyebrow x2, R infra orbital x3 (9) Complexity: simple   Destruction method: cryotherapy   Informed consent: discussed and consent obtained   Timeout:  patient name, date of birth, surgical site, and procedure verified Lesion destroyed using liquid nitrogen: Yes   Region frozen until ice ball extended beyond lesion: Yes   Outcome: patient tolerated procedure well with no complications   Post-procedure details: wound care instructions given   Additional details:  Prior to procedure, discussed risks of blister formation, small wound, skin dyspigmentation, or rare scar following cryotherapy. Recommend Vaseline ointment to treated areas while healing.    Return in about 1 year (around 09/04/2025) for TBSE, HxBCC, HxSCC.  I, Jill Parcell, CMA, am acting as scribe for Alm Rhyme, MD.    Documentation: I have reviewed the above documentation for accuracy and completeness, and I agree with the above.  Alm Rhyme, MD

## 2024-09-09 ENCOUNTER — Ambulatory Visit: Admitting: Podiatry

## 2024-09-09 VITALS — Ht 64.0 in | Wt 207.0 lb

## 2024-09-09 DIAGNOSIS — G629 Polyneuropathy, unspecified: Secondary | ICD-10-CM

## 2024-09-09 NOTE — Progress Notes (Signed)
 Subjective:   Patient ID: Pamela Jacobs, female   DOB: 68 y.o.   MRN: [id number]   HPI Patient presents stating the left big toe has been numb and she was concerned about this with occasional shooting pains.  States the arch is doing better   ROS      Objective:  Physical Exam  Neurovascular status intact with patient's left arch improved but the left big toe showing slight numbness like sensations and occasional shooting pain     Assessment:  Possibility this may be due to a proximal impingement of the nerve with no indications of other pathology good motion no loss of strength     Plan:  Difficult to make complete determination but at this time we will give it time and she was worried about losing her toe and I made her aware that is not something I would be concerned about

## 2024-09-19 ENCOUNTER — Other Ambulatory Visit: Payer: Self-pay | Admitting: Family Medicine

## 2024-09-19 DIAGNOSIS — Z1231 Encounter for screening mammogram for malignant neoplasm of breast: Secondary | ICD-10-CM

## 2024-10-29 ENCOUNTER — Encounter

## 2024-10-30 ENCOUNTER — Ambulatory Visit
Admission: RE | Admit: 2024-10-30 | Discharge: 2024-10-30 | Disposition: A | Source: Ambulatory Visit | Attending: Family Medicine | Admitting: Family Medicine

## 2024-10-30 DIAGNOSIS — Z1231 Encounter for screening mammogram for malignant neoplasm of breast: Secondary | ICD-10-CM | POA: Diagnosis present

## 2024-10-31 ENCOUNTER — Inpatient Hospital Stay
Admission: RE | Admit: 2024-10-31 | Discharge: 2024-10-31 | Disposition: A | Payer: Self-pay | Source: Ambulatory Visit | Attending: Family Medicine | Admitting: Family Medicine

## 2024-10-31 ENCOUNTER — Other Ambulatory Visit: Payer: Self-pay | Admitting: *Deleted

## 2024-10-31 DIAGNOSIS — Z1231 Encounter for screening mammogram for malignant neoplasm of breast: Secondary | ICD-10-CM

## 2024-11-08 ENCOUNTER — Other Ambulatory Visit: Payer: Self-pay | Admitting: Family Medicine

## 2024-11-08 DIAGNOSIS — R928 Other abnormal and inconclusive findings on diagnostic imaging of breast: Secondary | ICD-10-CM

## 2024-11-11 ENCOUNTER — Other Ambulatory Visit: Payer: Self-pay | Admitting: Family Medicine

## 2024-11-11 ENCOUNTER — Inpatient Hospital Stay: Admission: RE | Admit: 2024-11-11 | Discharge: 2024-11-11 | Attending: Family Medicine | Admitting: Family Medicine

## 2024-11-11 DIAGNOSIS — R928 Other abnormal and inconclusive findings on diagnostic imaging of breast: Secondary | ICD-10-CM

## 2024-11-26 ENCOUNTER — Ambulatory Visit
Admission: RE | Admit: 2024-11-26 | Discharge: 2024-11-26 | Disposition: A | Discharge status: Home / Self Care | Source: Ambulatory Visit | Attending: Family Medicine | Admitting: Family Medicine

## 2024-11-26 DIAGNOSIS — R928 Other abnormal and inconclusive findings on diagnostic imaging of breast: Secondary | ICD-10-CM

## 2024-11-26 HISTORY — PX: BREAST BIOPSY: SHX20

## 2024-11-26 MED ORDER — LIDOCAINE-EPINEPHRINE 1 %-1:100000 IJ SOLN
20.0000 mL | Freq: Once | INTRAMUSCULAR | Status: AC
  Administered 2024-11-26: 20 mL

## 2024-11-26 MED ORDER — CHLOROPROCAINE HCL (PF) 3 % IJ SOLN
5.0000 mL | Freq: Once | INTRAMUSCULAR | Status: AC
  Administered 2024-11-26: 150 mg

## 2024-11-26 MED ORDER — LIDOCAINE 1 % OPTIME INJ - NO CHARGE
5.0000 mL | Freq: Once | INTRAMUSCULAR | Status: AC
  Administered 2024-11-26: 5 mL
  Filled 2024-11-26: qty 6

## 2024-11-29 LAB — SURGICAL PATHOLOGY

## 2025-05-05 ENCOUNTER — Ambulatory Visit (INDEPENDENT_AMBULATORY_CARE_PROVIDER_SITE_OTHER): Admitting: Vascular Surgery

## 2025-09-10 ENCOUNTER — Ambulatory Visit: Admitting: Dermatology
